# Patient Record
Sex: Female | Born: 1985 | Race: White | Hispanic: No | Smoking: Current every day smoker
Health system: Southern US, Community
[De-identification: ages and names within clinical notes are randomized; demographics above are authoritative.]

## PROBLEM LIST (undated history)

## (undated) ENCOUNTER — Emergency Department: Disposition: A | Discharge status: Home / Self Care | Payer: Self-pay

## (undated) DIAGNOSIS — F32A Depression, unspecified: Secondary | ICD-10-CM

## (undated) DIAGNOSIS — M5134 Other intervertebral disc degeneration, thoracic region: Secondary | ICD-10-CM

## (undated) DIAGNOSIS — J45909 Unspecified asthma, uncomplicated: Secondary | ICD-10-CM

## (undated) DIAGNOSIS — K769 Liver disease, unspecified: Secondary | ICD-10-CM

## (undated) DIAGNOSIS — E785 Hyperlipidemia, unspecified: Secondary | ICD-10-CM

## (undated) DIAGNOSIS — K219 Gastro-esophageal reflux disease without esophagitis: Secondary | ICD-10-CM

## (undated) DIAGNOSIS — L732 Hidradenitis suppurativa: Secondary | ICD-10-CM

## (undated) DIAGNOSIS — F209 Schizophrenia, unspecified: Secondary | ICD-10-CM

## (undated) DIAGNOSIS — F319 Bipolar disorder, unspecified: Secondary | ICD-10-CM

## (undated) DIAGNOSIS — F329 Major depressive disorder, single episode, unspecified: Secondary | ICD-10-CM

## (undated) HISTORY — PX: OTHER SURGICAL HISTORY: SHX169

## (undated) HISTORY — DX: Hidradenitis suppurativa: L73.2

## (undated) HISTORY — DX: Liver disease, unspecified: K76.9

## (undated) HISTORY — DX: Gastro-esophageal reflux disease without esophagitis: K21.9

## (undated) HISTORY — DX: Major depressive disorder, single episode, unspecified: F32.9

## (undated) HISTORY — PX: TUBAL LIGATION: SHX77

## (undated) HISTORY — DX: Hyperlipidemia, unspecified: E78.5

## (undated) HISTORY — DX: Other intervertebral disc degeneration, thoracic region: M51.34

## (undated) HISTORY — DX: Unspecified asthma, uncomplicated: J45.909

## (undated) HISTORY — DX: Schizophrenia, unspecified: F20.9

## (undated) HISTORY — DX: Bipolar disorder, unspecified: F31.9

## (undated) HISTORY — DX: Depression, unspecified: F32.A

---
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
## 2009-05-06 ENCOUNTER — Emergency Department: Payer: Self-pay | Admitting: Emergency Medicine

## 2009-07-31 ENCOUNTER — Encounter: Payer: Self-pay | Admitting: Obstetrics & Gynecology

## 2009-08-31 ENCOUNTER — Encounter: Payer: Self-pay | Admitting: Maternal & Fetal Medicine

## 2009-09-14 ENCOUNTER — Observation Stay: Payer: Self-pay

## 2009-10-02 ENCOUNTER — Encounter: Payer: Self-pay | Admitting: Maternal & Fetal Medicine

## 2009-11-13 ENCOUNTER — Encounter: Payer: Self-pay | Admitting: Obstetrics and Gynecology

## 2009-11-22 ENCOUNTER — Observation Stay: Payer: Self-pay

## 2009-12-04 ENCOUNTER — Ambulatory Visit: Payer: Self-pay

## 2009-12-05 ENCOUNTER — Inpatient Hospital Stay: Payer: Self-pay

## 2009-12-06 LAB — PATHOLOGY REPORT

## 2009-12-20 ENCOUNTER — Emergency Department: Payer: Self-pay | Admitting: Emergency Medicine

## 2009-12-21 ENCOUNTER — Emergency Department: Payer: Self-pay | Admitting: Emergency Medicine

## 2012-01-21 IMAGING — US US OB FOLLOW-UP - NRPT [facility]
1 series · 14 of 28 positions shown · non-contrast
Comparison: none

[Series 1: us ob follow-up - nrpt [facility] · 14 of 41 slices shown]
[im 2/41]
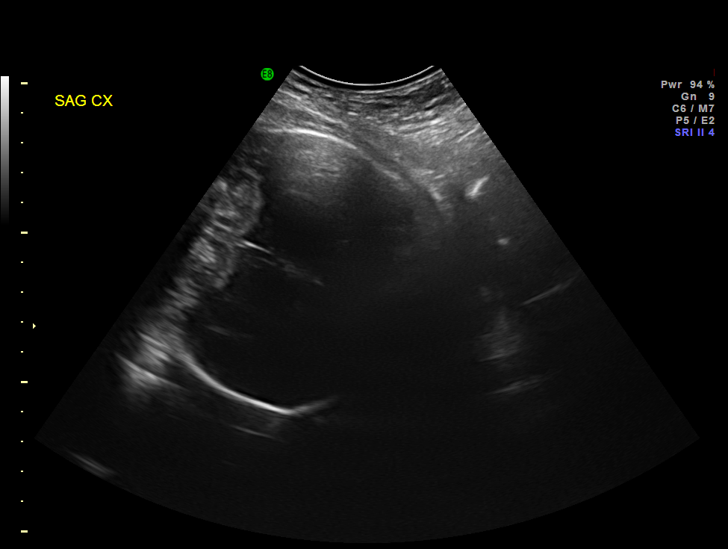
[im 5/41]
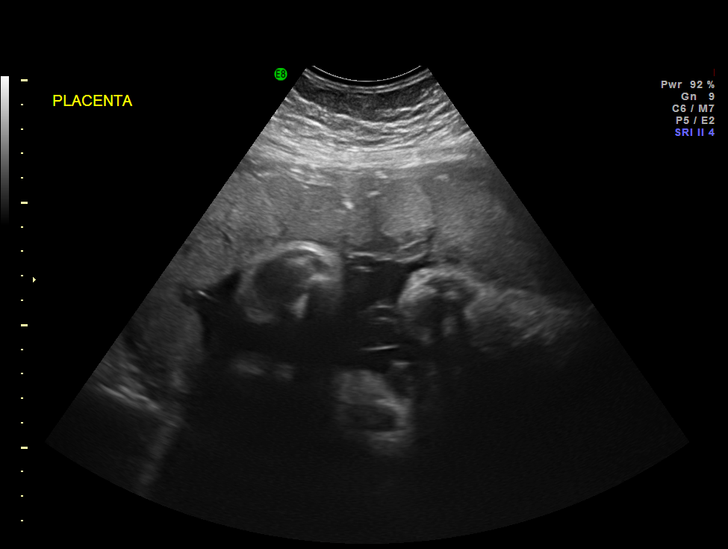
[im 8/41]
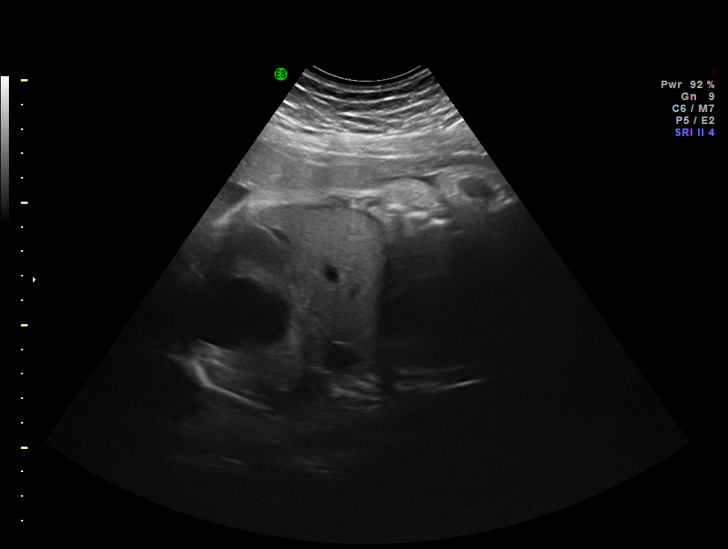
[im 11/41]
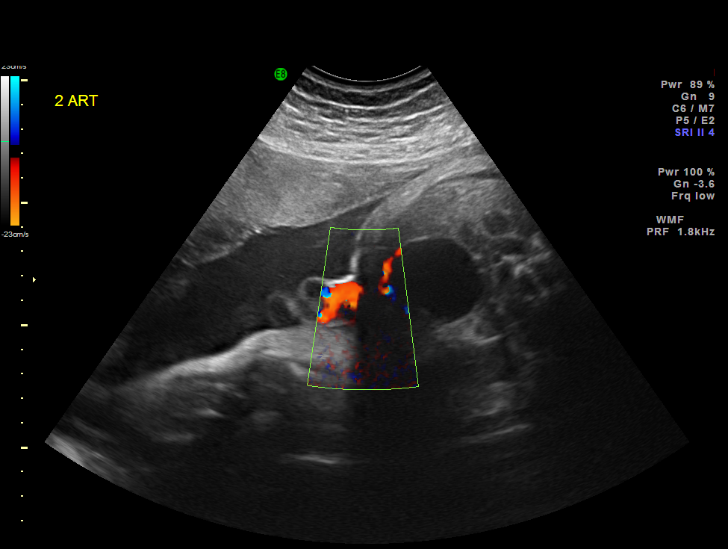
[im 14/41]
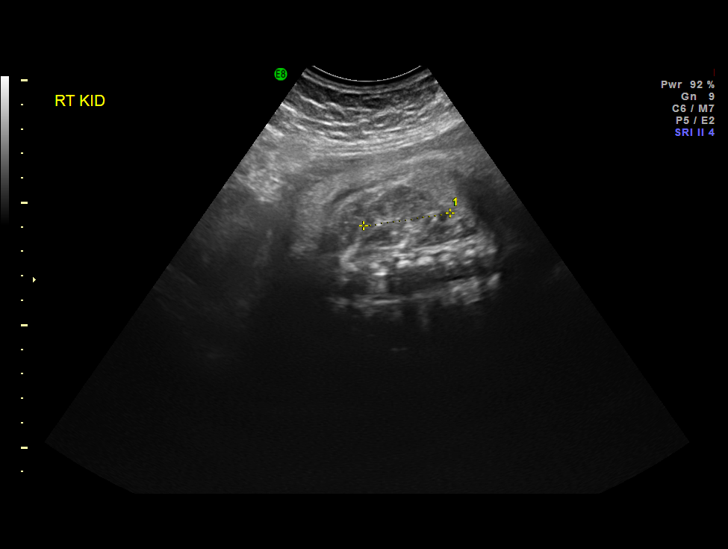
[im 17/41]
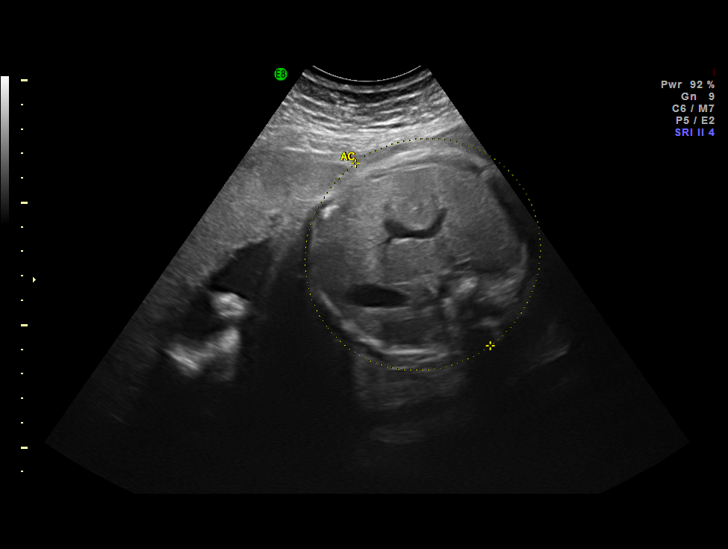
[im 20/41]
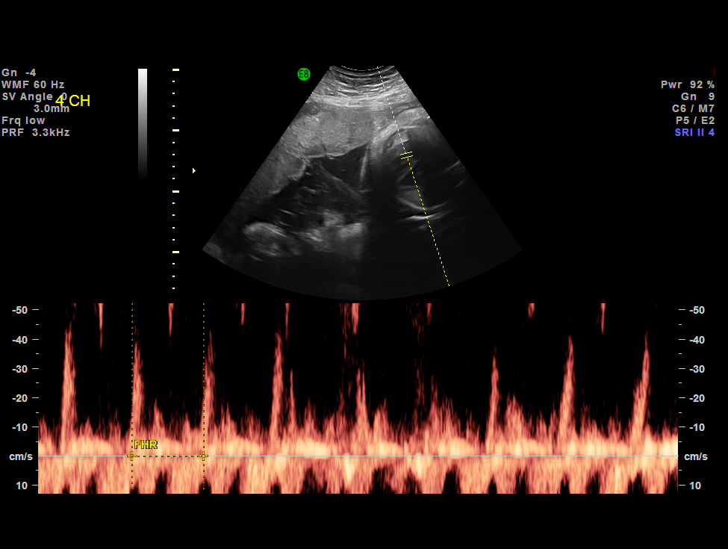
[im 23/41]
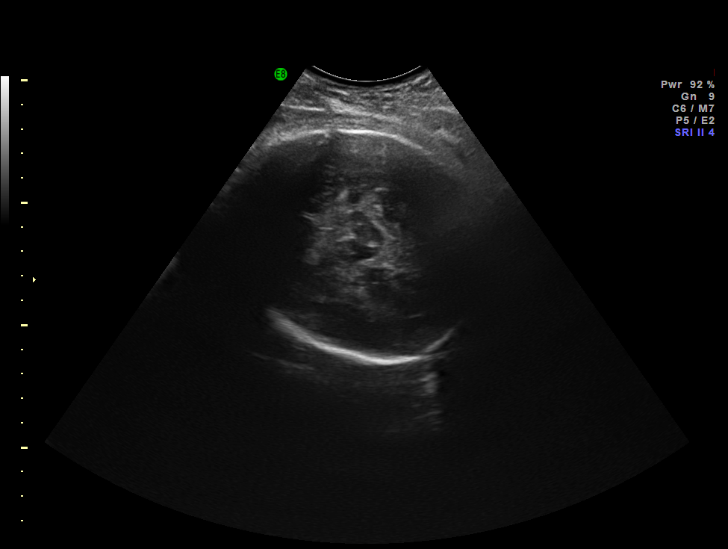
[im 26/41]
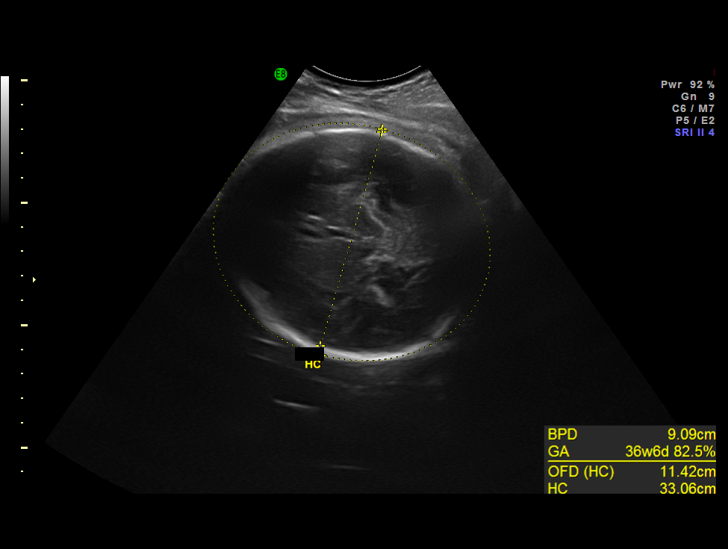
[im 29/41]
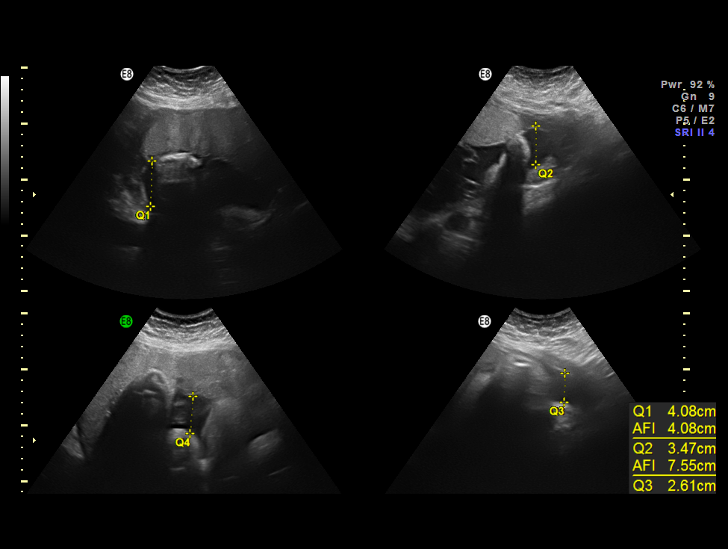
[im 32/41]
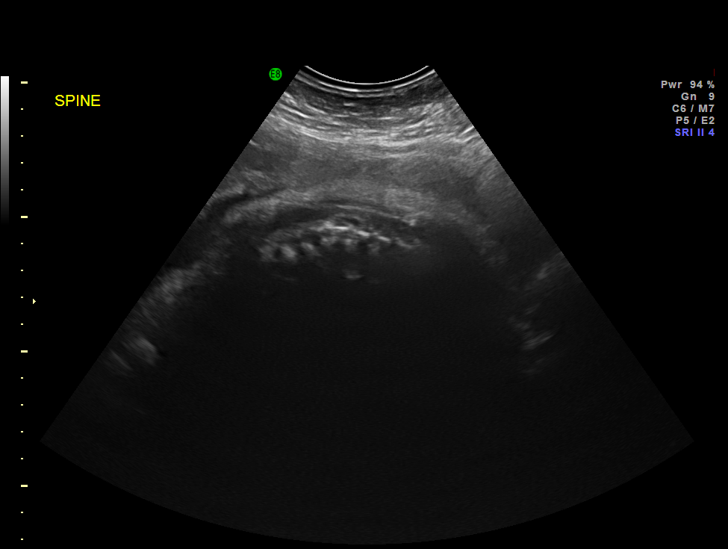
[im 35/41]
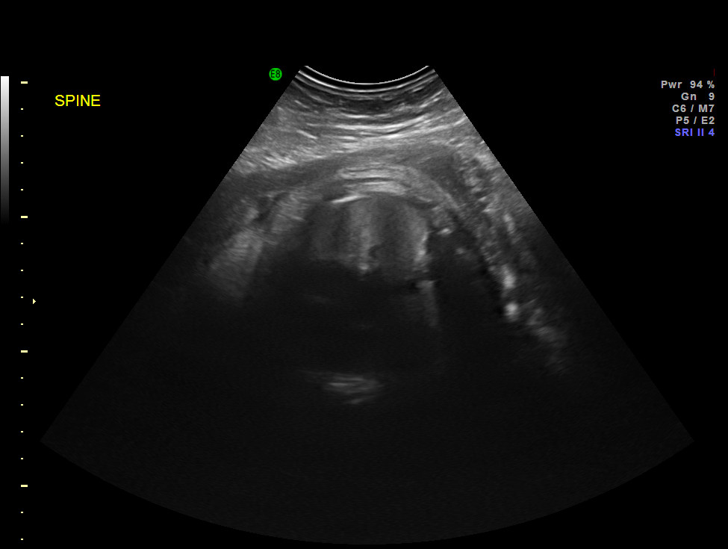
[im 38/41]
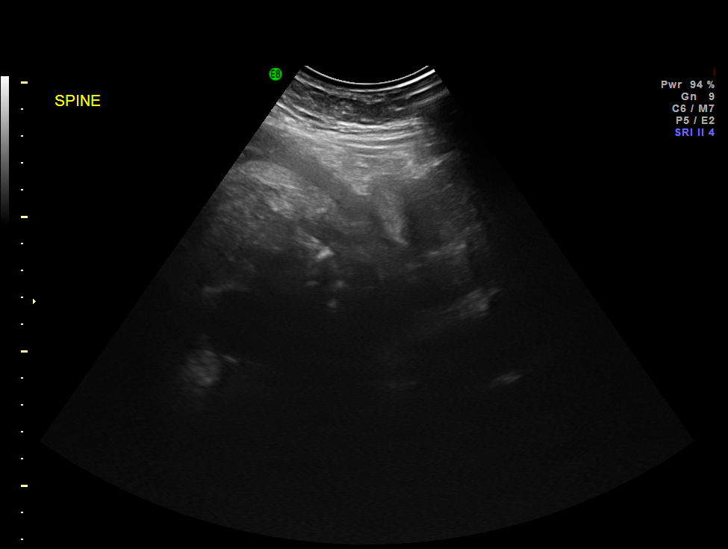
[im 41/41]
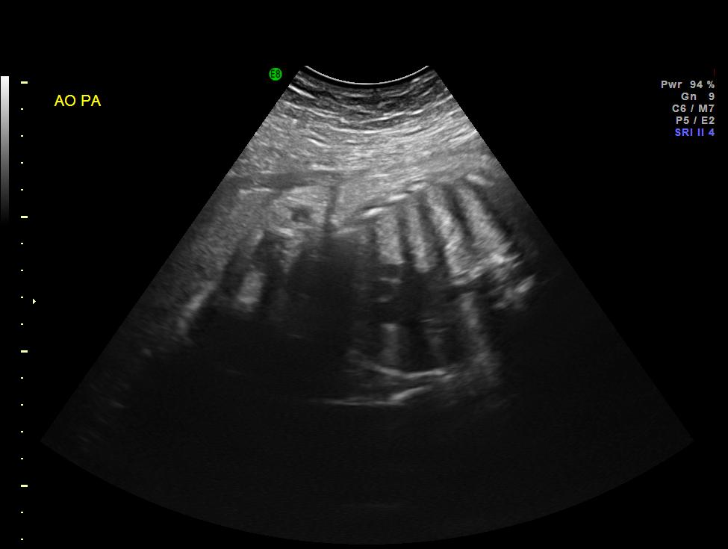

[14 of 28 positions shown; findings below may reference images not displayed]

IMAGES IMPORTED FROM THE SYNGO WORKFLOW SYSTEM
NO DICTATION FOR STUDY

## 2015-01-12 ENCOUNTER — Ambulatory Visit (INDEPENDENT_AMBULATORY_CARE_PROVIDER_SITE_OTHER): Payer: Managed Care, Other (non HMO) | Admitting: Allergy and Immunology

## 2015-01-12 ENCOUNTER — Encounter: Payer: Self-pay | Admitting: Allergy and Immunology

## 2015-01-12 VITALS — BP 122/64 | HR 72 | Temp 98.0°F | Resp 18 | Ht 63.5 in | Wt 213.8 lb

## 2015-01-12 DIAGNOSIS — J4520 Mild intermittent asthma, uncomplicated: Secondary | ICD-10-CM | POA: Diagnosis not present

## 2015-01-12 DIAGNOSIS — T7800XA Anaphylactic reaction due to unspecified food, initial encounter: Secondary | ICD-10-CM

## 2015-01-12 DIAGNOSIS — Z720 Tobacco use: Secondary | ICD-10-CM

## 2015-01-12 DIAGNOSIS — K219 Gastro-esophageal reflux disease without esophagitis: Secondary | ICD-10-CM

## 2015-01-12 MED ORDER — IPRATROPIUM-ALBUTEROL 0.5-2.5 (3) MG/3ML IN SOLN: 3.0000 mL | Freq: Once | RESPIRATORY_TRACT | Status: DC

## 2015-01-12 MED ORDER — ALBUTEROL SULFATE HFA 108 (90 BASE) MCG/ACT IN AERS: 2.0000 | INHALATION_SPRAY | RESPIRATORY_TRACT | Status: DC | PRN

## 2015-01-12 MED ORDER — ALBUTEROL SULFATE (2.5 MG/3ML) 0.083% IN NEBU
2.5000 mg | INHALATION_SOLUTION | Freq: Once | RESPIRATORY_TRACT | Status: AC
  Administered 2015-01-12: 2.5 mg via RESPIRATORY_TRACT

## 2015-01-12 MED ORDER — OMEPRAZOLE 40 MG PO CPDR: 40.0000 mg | DELAYED_RELEASE_CAPSULE | Freq: Every day | ORAL | Status: AC

## 2015-01-12 NOTE — Patient Instructions (Addendum)
  1. Allergen avoidance measures  2. Use nicotine replacement for tobacco smoke exposure  3. Epi-Pen, benadryl, MD / ER  4. If needed:   A. Ventolin HFA 2 puffs every 4-6 hours  B. OTC antihistamine  5. Get a flu vaccine  6. Challenge? - get milk component immunocap test  7. Treat reflux:   A. Taper off caffeine slowly  B. Omeprazole 40mg  one tablet on time per day  8. Return in one year or earlier if problem

## 2015-01-12 NOTE — Progress Notes (Signed)
[facility]    NEW PATIENT NOTE    Subjective:   Patient ID: Lauren Wall is a 29 y.o. female with a chief complaint of New Patient (Initial Visit)  and the following problems:  HPI Comments:  Lauren Wall presents this clinic with complaints of food allergy. Apparently in September she required an emergency room evaluation for the development of hives and breathing difficulties after consuming milk. Since that point time she is developed hives when she has exposure to any dairy products even if she touches the dairy product. She apparently self injected herself with an EpiPen on one occasion. There are no other associated systemic constitutional symptoms. She did have a history of "lactose intolerance", which was diagnosed many years ago manifested as bloating and diarrhea after consuming milk products. She might have developed some hives as well at that point in time. There are no other food products that cause her a problem.  Lauren Wall also has a history of asthma since childhood. She's been given a controller agent in the past. She is using her bronchodilator few times per month. Provoking factors include exposure to exercise and cold and pollen.. Her last administration of prednisone was years ago. She does smoke a prostate one half pack per day and has done so for 21 years. She smokes for stress reduction.  Lauren Wall also has an issue with reflux. She has reflux up into her throat. She vomits approximately one time per week. She drinks her coffee every day and has a soda every day.   Past Medical History  Diagnosis Date  . Asthma   . Hidradenitis suppurativa   . Bipolar disorder (HCC)   . DDD (degenerative disc disease), thoracic     Past Surgical History  Procedure Laterality Date  . C sections  2006, [id number], 2011     Current outpatient prescriptions:  .  acetaminophen (TYLENOL) 500 MG tablet, Take 1,000 mg by mouth daily as  needed., Disp: , Rfl:  .  albuterol (PROVENTIL HFA;VENTOLIN HFA) 108 (90 BASE) MCG/ACT inhaler, Inhale 2 puffs into the lungs every 4 (four) hours as needed., Disp: 1 Inhaler, Rfl: 1 .  clobetasol cream (TEMOVATE) 0.05 %, Apply 1 application topically 3 (three) times daily., Disp: , Rfl:  .  EPINEPHrine (EPIPEN 2-PAK) 0.3 mg/0.3 mL IJ SOAJ injection, Inject 0.3 mg into the muscle as needed., Disp: , Rfl:  .  lithium carbonate 300 MG capsule, Take 300 mg by mouth 2 (two) times daily., Disp: , Rfl:  .  lurasidone (LATUDA) 20 MG TABS tablet, Take 20 mg by mouth every evening., Disp: , Rfl:  .  Minocycline HCl 100 MG SOLR, Take 100 mg by mouth daily., Disp: , Rfl:  .  omeprazole (PRILOSEC) 40 MG capsule, Take 1 capsule (40 mg total) by mouth daily., Disp: 30 capsule, Rfl: 5  Meds ordered this encounter  Medications  . DISCONTD: ipratropium-albuterol (DUONEB) 0.5-2.5 (3) MG/3ML nebulizer solution 3 mL    Sig:   . albuterol (PROVENTIL HFA;VENTOLIN HFA) 108 (90 BASE) MCG/ACT inhaler    Sig: Inhale 2 puffs into the lungs every 4 (four) hours as needed.    Dispense:  1 Inhaler    Refill:  1  . omeprazole (PRILOSEC) 40 MG capsule    Sig: Take 1 capsule (40 mg total) by mouth daily.    Dispense:  30 capsule    Refill:  5  . albuterol (PROVENTIL) (2.5 MG/3ML) 0.083% nebulizer  solution 2.5 mg    Sig:     Allergies  Allergen Reactions  . Lactase Hives, Itching and Shortness Of Breath  . Sulfamethoxazole-Trimethoprim Hives    Welts and swelling  . Ibuprofen Hives    Welts, swelling, and sore throat    Review of Systems  Constitutional: Negative for fever, chills, weight loss and malaise/fatigue.  HENT: Negative for congestion, ear discharge, ear pain, hearing loss, nosebleeds, sore throat and tinnitus.   Eyes: Negative for blurred vision, pain, discharge and redness.  Respiratory: Negative for cough, hemoptysis, sputum production, shortness of breath, wheezing and stridor.   Cardiovascular:  Negative for chest pain, palpitations and leg swelling.  Gastrointestinal: Negative for heartburn, nausea, vomiting, abdominal pain and diarrhea.  Musculoskeletal: Negative for myalgias, back pain and joint pain.       Upper back degenerative disc disease  Skin: Positive for rash. Negative for itching.       Hidradenitis   Neurological: Negative for dizziness and headaches.  Psychiatric/Behavioral: Positive for depression.       Bipolar disorder, schizophrenia, and depression    Family History  Problem Relation Age of Onset  . Eczema Daughter     Social History   Social History  . Marital Status: Married    Spouse Name: N/A  . Number of Children: N/A  . Years of Education: N/A   Occupational History  . Not on file.   Social History Main Topics  . Smoking status: Smoker, Current Status Unknown -- 0.50 packs/day    Types: Cigarettes  . Smokeless tobacco: Not on file  . Alcohol Use: Not on file  . Drug Use: No  . Sexual Activity: Not on file   Other Topics Concern  . Not on file   Social History Narrative  . No narrative on file    Environmental and Social history  Lauren Wall lives in a house with a dry environment, carpeting in the bedroom, no animals located inside the house, actually smoking tobacco products, and no employment at this point in time.   Objective:   Filed Vitals:   01/12/15 0904  BP: 122/64  Pulse: 72  Temp: 98 F (36.7 C)  Resp: 18    Physical Exam  Constitutional: She is well-developed, well-nourished, and in no distress. No distress.  HENT:  Head: Normocephalic. Head is without laceration, without right periorbital erythema and without left periorbital erythema. Hair is normal.  Right Ear: Tympanic membrane and external ear normal. No lacerations. No drainage or tenderness. No foreign bodies. Tympanic membrane is not injected, not scarred, not perforated, not erythematous, not retracted and not bulging. No middle ear effusion.  Left Ear:  Tympanic membrane and external ear normal. No lacerations. No drainage or tenderness. No foreign bodies. Tympanic membrane is not injected, not scarred, not perforated, not erythematous, not retracted and not bulging.  No middle ear effusion.  Nose: Nose normal. No mucosal edema, rhinorrhea, nose lacerations or septal deviation.  No foreign bodies.  Mouth/Throat: Uvula is midline and oropharynx is clear and moist. No oral lesions. No uvula swelling. No oropharyngeal exudate, posterior oropharyngeal edema, posterior oropharyngeal erythema or tonsillar abscesses.  Eyes: Conjunctivae are normal. Pupils are equal, round, and reactive to light. Right eye exhibits no discharge. Left eye exhibits no discharge. No scleral icterus.  Neck: No tracheal deviation present. No thyromegaly present.  Cardiovascular: Normal rate, regular rhythm and normal heart sounds.  Exam reveals no gallop and no friction rub.   No murmur heard. Pulmonary/Chest: Effort  normal and breath sounds normal. No stridor. No respiratory distress. She has no wheezes. She has no rales. She exhibits no tenderness.  Abdominal: Soft. She exhibits no distension and no mass. There is no tenderness. There is no rebound and no guarding.  Musculoskeletal: She exhibits no edema or tenderness.  Lymphadenopathy:    She has no cervical adenopathy.  Neurological: She is alert. Gait normal.  Skin: No rash noted. She is not diaphoretic. No erythema. No pallor.  Psychiatric: Mood and affect normal.    Diagnostics:  Allergy skin tests were performed. She did not demonstrate any hypersensitivity to a screening panel of aeroallergens or foods including milk on  epicutaneous and intradermal skin testing  Spirometry was performed and demonstrated an FEV1 of 1.69 @ 55 % of predicted. Following the administration of nebulized albuterol her FEV1 did not increase.  The patient had an Asthma Control Test with the following results:  .  Not  completed   Assessment and Plan:    1. Allergy with anaphylaxis due to food, initial encounter   2. Mild intermittent asthma, uncomplicated   3. Tobacco abuse   4. Gastroesophageal reflux disease, esophagitis presence not specified     1. Allergen avoidance measures  2. Use nicotine replacement for tobacco smoke exposure  3. Epi-Pen, benadryl, MD / ER  4. If needed:   A. Ventolin HFA 2 puffs every 4-6 hours  B. OTC antihistamine  5. Get a flu vaccine  6. Challenge? - get milk component immunocap test  7. Treat reflux:   A. Taper off caffeine slowly  B. Omeprazole 40mg  one tablet on time per day  8. Return in one year or earlier if problem  I will investigate [place]'s potential milk allergy by checking a minute And if this is negative we will challenge her with milk in the clinic. I had a long talk with her today about the need to stop her tobacco hobby as this is no doubt tearing up her lungs. As well, we will treat her reflux which appears to be somewhat active with the therapy mentioned above.    Lauren SchimkeEric Lanetta Figuero, MD [facility]

## 2015-01-15 LAB — IGE MILK W/ COMPONENT REFLEX: Milk IgE: <0.1 kU/L

## 2015-01-16 NOTE — Progress Notes (Signed)
Patient advised results of labs appt made for challenge 11/21

## 2015-02-13 ENCOUNTER — Encounter: Payer: Managed Care, Other (non HMO) | Admitting: Allergy and Immunology

## 2015-11-28 ENCOUNTER — Emergency Department: Payer: Medicaid Other

## 2015-11-28 ENCOUNTER — Emergency Department
Admission: EM | Admit: 2015-11-28 | Discharge: 2015-11-28 | Disposition: A | Discharge status: Home / Self Care | Payer: Medicaid Other | Attending: Emergency Medicine | Admitting: Emergency Medicine

## 2015-11-28 ENCOUNTER — Encounter: Payer: Self-pay | Admitting: Medical Oncology

## 2015-11-28 DIAGNOSIS — F17210 Nicotine dependence, cigarettes, uncomplicated: Secondary | ICD-10-CM | POA: Diagnosis not present

## 2015-11-28 DIAGNOSIS — L03115 Cellulitis of right lower limb: Secondary | ICD-10-CM | POA: Diagnosis not present

## 2015-11-28 DIAGNOSIS — Z79899 Other long term (current) drug therapy: Secondary | ICD-10-CM | POA: Diagnosis not present

## 2015-11-28 DIAGNOSIS — M79671 Pain in right foot: Secondary | ICD-10-CM | POA: Diagnosis present

## 2015-11-28 DIAGNOSIS — Z791 Long term (current) use of non-steroidal anti-inflammatories (NSAID): Secondary | ICD-10-CM | POA: Insufficient documentation

## 2015-11-28 DIAGNOSIS — J45909 Unspecified asthma, uncomplicated: Secondary | ICD-10-CM | POA: Diagnosis not present

## 2015-11-28 LAB — CBC WITH DIFFERENTIAL/PLATELET
BASOS PCT: 1 %
Basophils Absolute: 0.2 10*3/uL — ABNORMAL HIGH (ref 0–0.1)
EOS ABS: 0.3 10*3/uL (ref 0–0.7)
EOS PCT: 2 %
HCT: 39.7 % (ref 35.0–47.0)
HEMOGLOBIN: 14.1 g/dL (ref 12.0–16.0)
LYMPHS ABS: 2.8 10*3/uL (ref 1.0–3.6)
Lymphocytes Relative: 20 %
MCH: 32.7 pg (ref 26.0–34.0)
MCHC: 35.5 g/dL (ref 32.0–36.0)
MCV: 91.9 fL (ref 80.0–100.0)
MONO ABS: 0.9 10*3/uL (ref 0.2–0.9)
MONOS PCT: 7 %
Neutro Abs: 9.4 10*3/uL — ABNORMAL HIGH (ref 1.4–6.5)
Neutrophils Relative %: 70 %
PLATELETS: 241 10*3/uL (ref 150–440)
RBC: 4.32 MIL/uL (ref 3.80–5.20)
RDW: 12.8 % (ref 11.5–14.5)
WBC: 13.6 10*3/uL — ABNORMAL HIGH (ref 3.6–11.0)

## 2015-11-28 LAB — URIC ACID: URIC ACID, SERUM: 6 mg/dL (ref 2.3–6.6)

## 2015-11-28 LAB — BASIC METABOLIC PANEL
Anion gap: 5 (ref 5–15)
BUN: 12 mg/dL (ref 6–20)
CALCIUM: 8.7 mg/dL — AB (ref 8.9–10.3)
CHLORIDE: 109 mmol/L (ref 101–111)
CO2: 24 mmol/L (ref 22–32)
CREATININE: 0.75 mg/dL (ref 0.44–1.00)
GFR calc Af Amer: >60 mL/min (ref >60)
GFR calc non Af Amer: >60 mL/min (ref >60)
Glucose, Bld: 85 mg/dL (ref 65–99)
Potassium: 4.6 mmol/L (ref 3.5–5.1)
Sodium: 138 mmol/L (ref 135–145)

## 2015-11-28 MED ORDER — HYDROCODONE-ACETAMINOPHEN 5-325 MG PO TABS
1.0000 | ORAL_TABLET | Freq: Once | ORAL | Status: AC
  Administered 2015-11-28: 1 via ORAL
  Filled 2015-11-28: qty 1

## 2015-11-28 MED ORDER — HYDROCODONE-ACETAMINOPHEN 5-325 MG PO TABS: 1.0000 | ORAL_TABLET | Freq: Four times a day (QID) | ORAL | 0 refills | Status: DC | PRN

## 2015-11-28 MED ORDER — CLINDAMYCIN HCL 300 MG PO CAPS: 300.0000 mg | ORAL_CAPSULE | Freq: Three times a day (TID) | ORAL | 0 refills | Status: DC

## 2015-11-28 MED ORDER — CLINDAMYCIN PHOSPHATE 600 MG/50ML IV SOLN
600.0000 mg | Freq: Once | INTRAVENOUS | Status: AC
  Administered 2015-11-28: 600 mg via INTRAVENOUS
  Filled 2015-11-28: qty 50

## 2015-11-28 NOTE — ED Triage Notes (Signed)
Rt foot pain that began yesterday without injury.

## 2015-11-28 NOTE — ED Provider Notes (Signed)
[facility] Emergency Department Provider Note  ____________________________________________  Time seen: Approximately 8:03 AM  I have reviewed the triage vital signs and the nursing notes.   HISTORY  Chief Complaint Foot Pain    HPI Lauren Wall is a 30 y.o. female , NAD, presents to the emergency department with 1 day history of right foot pain and swelling. States pain began yesterday without any injury or trauma. Noted swelling increase over the last 12 hours. Has not noted any open wounds, skin sores, redness or abnormal warmth to the foot or leg. Does note that it hurts about the distal portion of her foot and ball of the foot and she is unable to bear weight due to significant pain. Denies any falls. Has not had any numbness, weakness, tingling. States pain is a shooting pain when she attempts to bear weight. Has not had any fevers, chills, body aches. Has had no abdominal pain, nausea, vomiting. Does note a history of prediabetes and states her blood sugars run anywhere from 80s to 300s. She did check her blood sugar 2 days ago and it was in the 80s.   Past Medical History:  Diagnosis Date  . Asthma   . Bipolar disorder (HCC)   . DDD (degenerative disc disease), thoracic   . Hidradenitis suppurativa     There are no active problems to display for this patient.   Past Surgical History:  Procedure Laterality Date  . c sections  2006, 2007,2009, 2011    Prior to Admission medications   Medication Sig Start Date End Date Taking? Authorizing Provider  acetaminophen (TYLENOL) 500 MG tablet Take 1,000 mg by mouth daily as needed.    Historical Provider, MD  albuterol (PROVENTIL HFA;VENTOLIN HFA) 108 (90 BASE) MCG/ACT inhaler Inhale 2 puffs into the lungs every 4 (four) hours as needed. 01/12/15 01/12/16  Jessica PriestEric J Kozlow, MD  clindamycin (CLEOCIN) 300 MG capsule Take 1 capsule (300 mg total) by mouth 3 (three) times daily. 11/28/15   Melessia Kaus L Mike Hamre, PA-C   clobetasol cream (TEMOVATE) 0.05 % Apply 1 application topically 3 (three) times daily.    Historical Provider, MD  EPINEPHrine (EPIPEN 2-PAK) 0.3 mg/0.3 mL IJ SOAJ injection Inject 0.3 mg into the muscle as needed. 11/25/14   Historical Provider, MD  HYDROcodone-acetaminophen (NORCO) 5-325 MG tablet Take 1 tablet by mouth every 6 (six) hours as needed for severe pain. 11/28/15   Kyliee Ortego L Velencia Lenart, PA-C  lithium carbonate 300 MG capsule Take 300 mg by mouth 2 (two) times daily.    Historical Provider, MD  lurasidone (LATUDA) 20 MG TABS tablet Take 20 mg by mouth every evening.    Historical Provider, MD  Minocycline HCl 100 MG SOLR Take 100 mg by mouth daily.    Historical Provider, MD  omeprazole (PRILOSEC) 40 MG capsule Take 1 capsule (40 mg total) by mouth daily. 01/12/15   Jessica PriestEric J Kozlow, MD    Allergies Lactase; Sulfamethoxazole-trimethoprim; and Ibuprofen  Family History  Problem Relation Age of Onset  . Eczema Daughter     Social History Social History  Substance Use Topics  . Smoking status: Smoker, Current Status Unknown    Packs/day: 0.50    Types: Cigarettes  . Smokeless tobacco: Not on file  . Alcohol use Not on file     Review of Systems  Constitutional: No fever/chills Cardiovascular: No chest pain, Palpitations. Respiratory: No cough. No shortness of breath. No wheezing.  Gastrointestinal: No abdominal pain.  No nausea,  vomiting.   Musculoskeletal: Positive right foot pain. Negative ankle, lower leg, knee, hip, back pain.  Skin: Positive swelling right foot. Negative for rash, redness, skin sores, open wounds, abnormal warmth. Neurological: Negative for headaches, focal weakness or numbness. No tingling. 10-point ROS otherwise negative.  ____________________________________________   PHYSICAL EXAM:  VITAL SIGNS: ED Triage Vitals  Enc Vitals Group     BP 11/28/15 0739 117/79     Pulse Rate 11/28/15 0739 98     Resp 11/28/15 0739 19     Temp 11/28/15 0739 97.5  F (36.4 C)     Temp Source 11/28/15 0739 Oral     SpO2 11/28/15 0739 99 %     Weight 11/28/15 0738 190 lb (86.2 kg)     Height 11/28/15 0738 5\' 3"  (1.6 m)     Head Circumference --      Peak Flow --      Pain Score 11/28/15 0738 5     Pain Loc --      Pain Edu? --      Excl. in GC? --      Constitutional: Alert and oriented. Well appearing and in no acute distress. Eyes: Conjunctivae are normal.  Head: Atraumatic. Cardiovascular: Good peripheral circulationWith 2+ pulses noted about the right lower extremity. Capillary refill is brisk in all digits of the right foot. Respiratory: Normal respiratory effort without tachypnea or retractions.  Musculoskeletal: Tenderness to palpation about the distal right foot including toes as well as the ball of the foot. Mild swelling is noted but no erythema or warmth. Full range of motion of the right ankle. Patient not willing to move the right foot or toes due to pain. No lower extremity tenderness nor edema.  No joint effusions. Neurologic:  Normal speech and language. No gross focal neurologic deficits are appreciated. Sensation light touch grossly intact about the right lower extremity Skin:  Skin is warm, dry and intact. No rash or redness, skin sores, open wounds noted. Psychiatric: Mood and affect are normal. Speech and behavior are normal. Patient exhibits appropriate insight and judgement.   ____________________________________________   LABS (all labs ordered are listed, but only abnormal results are displayed)  Labs Reviewed  BASIC METABOLIC PANEL - Abnormal; Notable for the following:       Result Value   Calcium 8.7 (*)    All other components within normal limits  CBC WITH DIFFERENTIAL/PLATELET - Abnormal; Notable for the following:    WBC 13.6 (*)    Neutro Abs 9.4 (*)    Basophils Absolute 0.2 (*)    All other components within normal limits  URIC ACID    ____________________________________________  EKG  None ____________________________________________  RADIOLOGY I have personally viewed and evaluated these images (plain radiographs) as part of my medical decision making, as well as reviewing the written report by the radiologist.  Dg Foot Complete Right  Result Date: 11/28/2015 CLINICAL DATA:  Right foot pain at the base of the great toe. No acute injury. EXAM: RIGHT FOOT COMPLETE - 3+ VIEW COMPARISON:  None. FINDINGS: There is soft tissue swelling over the dorsum of the foot at the level of the MTP joints. No acute osseous abnormality is present. The joints are located. No significant joint disease is evident. IMPRESSION: 1. No acute or focal osseous abnormality. 2. Soft tissue swelling over the dorsum of the foot. This could represent soft tissue injury or cellulitis. Electronically Signed   By: Marin Roberts M.D.   On: 11/28/2015  08:24    ____________________________________________    PROCEDURES  Procedure(s) performed: None   Procedures   Medications  clindamycin (CLEOCIN) IVPB 600 mg (600 mg Intravenous New Bag/Given 11/28/15 1012)  HYDROcodone-acetaminophen (NORCO/VICODIN) 5-325 MG per tablet 1 tablet (1 tablet Oral Given 11/28/15 0904)     ____________________________________________   INITIAL IMPRESSION / ASSESSMENT AND PLAN / ED COURSE  Pertinent labs & imaging results that were available during my care of the patient were reviewed by me and considered in my medical decision making (see chart for details).  Clinical Course    Patient's diagnosis is consistent with Cellulitis of right foot. Patient will be discharged home with prescriptions for clindamycin and Norco to take as directed. Patient was given crutches to assist with ambulation. She is to keep the right foot elevated when not ambulating. Work note given to excuse over the next 3 days. Patient is to follow up with her primary care provider in 2-3  days for recheck. Patient is given ED precautions to return to the ED for any worsening or new symptoms.    ____________________________________________  FINAL CLINICAL IMPRESSION(S) / ED DIAGNOSES  Final diagnoses:  Cellulitis of right foot      NEW MEDICATIONS STARTED DURING THIS VISIT:  New Prescriptions   CLINDAMYCIN (CLEOCIN) 300 MG CAPSULE    Take 1 capsule (300 mg total) by mouth 3 (three) times daily.   HYDROCODONE-ACETAMINOPHEN (NORCO) 5-325 MG TABLET    Take 1 tablet by mouth every 6 (six) hours as needed for severe pain.         Hope Pigeon, PA-C 11/28/15 1045    Emily Filbert, MD 11/28/15 [phone]

## 2015-11-28 NOTE — ED Notes (Signed)
Pt alert and oriented X4, active, cooperative, pt in NAD. RR even and unlabored, color WNL.  Pt informed to return if any life threatening symptoms occur.   

## 2015-11-28 NOTE — ED Notes (Signed)
See triage note  Right foot pain since yesterday   Denies any specific injury  No deformity noted

## 2015-11-28 NOTE — Discharge Instructions (Signed)
Keep right foot elevated when not ambulating. Use crutches to limit weight bearing.

## 2016-01-15 ENCOUNTER — Ambulatory Visit: Payer: Managed Care, Other (non HMO) | Admitting: Allergy and Immunology

## 2016-03-12 ENCOUNTER — Emergency Department
Admission: EM | Admit: 2016-03-12 | Discharge: 2016-03-12 | Disposition: A | Discharge status: Home / Self Care | Payer: Medicaid Other | Attending: Emergency Medicine | Admitting: Emergency Medicine

## 2016-03-12 DIAGNOSIS — J020 Streptococcal pharyngitis: Secondary | ICD-10-CM | POA: Diagnosis not present

## 2016-03-12 DIAGNOSIS — Z79899 Other long term (current) drug therapy: Secondary | ICD-10-CM | POA: Insufficient documentation

## 2016-03-12 DIAGNOSIS — J45909 Unspecified asthma, uncomplicated: Secondary | ICD-10-CM | POA: Insufficient documentation

## 2016-03-12 DIAGNOSIS — H65191 Other acute nonsuppurative otitis media, right ear: Secondary | ICD-10-CM | POA: Insufficient documentation

## 2016-03-12 DIAGNOSIS — F17210 Nicotine dependence, cigarettes, uncomplicated: Secondary | ICD-10-CM | POA: Insufficient documentation

## 2016-03-12 DIAGNOSIS — H65111 Acute and subacute allergic otitis media (mucoid) (sanguinous) (serous), right ear: Secondary | ICD-10-CM

## 2016-03-12 DIAGNOSIS — H9201 Otalgia, right ear: Secondary | ICD-10-CM | POA: Diagnosis present

## 2016-03-12 LAB — POCT RAPID STREP A: Streptococcus, Group A Screen (Direct): NEGATIVE

## 2016-03-12 MED ORDER — CETIRIZINE HCL 10 MG PO TABS: 10.0000 mg | ORAL_TABLET | Freq: Every day | ORAL | 0 refills | Status: DC

## 2016-03-12 MED ORDER — AMOXICILLIN 400 MG/5ML PO SUSR: 1000.0000 mg | Freq: Two times a day (BID) | ORAL | 0 refills | Status: DC

## 2016-03-12 MED ORDER — FLUTICASONE PROPIONATE 50 MCG/ACT NA SUSP: 1.0000 | Freq: Two times a day (BID) | NASAL | 0 refills | Status: DC

## 2016-03-12 MED ORDER — MAGIC MOUTHWASH W/LIDOCAINE: 5.0000 mL | Freq: Four times a day (QID) | ORAL | 0 refills | Status: DC

## 2016-03-12 NOTE — ED Notes (Signed)
Pt. States "i need pain medicine" and informed that provider will order pain medication deemed most appropriate because this nurse is unqualified to make that decision.

## 2016-03-12 NOTE — ED Notes (Signed)

## 2016-03-12 NOTE — ED Triage Notes (Signed)
Pt in with co sore throat and right earache x 2 days, hx of strep states feels the same.

## 2016-03-12 NOTE — ED Provider Notes (Signed)
[facility] Emergency Department Provider Note  ____________________________________________  Time seen: Approximately 9:32 PM  I have reviewed the triage vital signs and the nursing notes.   HISTORY  Chief Complaint Sore Throat    HPI Lauren HellingJane E Wall is a 30 y.o. female who presents to emergency department complaining of right ear pain and sore throat. Patient states that her daughter has a viral infection and over the past several days she has had increasing symptoms of nasal congestion, ear pain, sore throat. Patient reports that she has also had a fever with this complaint. Patient reports fever does respond well to Tylenol or Motrin. She denies any headache, visual changes, neck pain, chest pain, shortness of breath, abdominal pain, nausea or vomiting. Patient has had some nasal congestion. She reports right ear pain that is increasing in intensity. She also reports sharp sore throat. Patient has had a very sporadic, mild, clearing cough. No definitive coughing. Patient states that she has a history of recurrent strep infections and this is consistent with previous strep infection.   Past Medical History:  Diagnosis Date  . Asthma   . Bipolar disorder (HCC)   . DDD (degenerative disc disease), thoracic   . Hidradenitis suppurativa     There are no active problems to display for this patient.   Past Surgical History:  Procedure Laterality Date  . c sections  2006, 2007,2009, 2011    Prior to Admission medications   Medication Sig Start Date End Date Taking? Authorizing Provider  acetaminophen (TYLENOL) 500 MG tablet Take 1,000 mg by mouth daily as needed.    Historical Provider, MD  albuterol (PROVENTIL HFA;VENTOLIN HFA) 108 (90 BASE) MCG/ACT inhaler Inhale 2 puffs into the lungs every 4 (four) hours as needed. 01/12/15 01/12/16  Jessica PriestEric J Kozlow, MD  amoxicillin (AMOXIL) 400 MG/5ML suspension Take 12.5 mLs (1,000 mg total) by mouth 2 (two) times daily.  03/12/16   Delorise RoyalsJonathan D Seon Gaertner, PA-C  cetirizine (ZYRTEC) 10 MG tablet Take 1 tablet (10 mg total) by mouth daily. 03/12/16   Delorise RoyalsJonathan D Sirron Francesconi, PA-C  clindamycin (CLEOCIN) 300 MG capsule Take 1 capsule (300 mg total) by mouth 3 (three) times daily. 11/28/15   Jami L Hagler, PA-C  clobetasol cream (TEMOVATE) 0.05 % Apply 1 application topically 3 (three) times daily.    Historical Provider, MD  EPINEPHrine (EPIPEN 2-PAK) 0.3 mg/0.3 mL IJ SOAJ injection Inject 0.3 mg into the muscle as needed. 11/25/14   Historical Provider, MD  fluticasone (FLONASE) 50 MCG/ACT nasal spray Place 1 spray into both nostrils 2 (two) times daily. 03/12/16   Delorise RoyalsJonathan D Sabrinna Yearwood, PA-C  HYDROcodone-acetaminophen (NORCO) 5-325 MG tablet Take 1 tablet by mouth every 6 (six) hours as needed for severe pain. 11/28/15   Jami L Hagler, PA-C  lithium carbonate 300 MG capsule Take 300 mg by mouth 2 (two) times daily.    Historical Provider, MD  lurasidone (LATUDA) 20 MG TABS tablet Take 20 mg by mouth every evening.    Historical Provider, MD  magic mouthwash w/lidocaine SOLN Take 5 mLs by mouth 4 (four) times daily. 03/12/16   Delorise RoyalsJonathan D Rajveer Handler, PA-C  Minocycline HCl 100 MG SOLR Take 100 mg by mouth daily.    Historical Provider, MD  omeprazole (PRILOSEC) 40 MG capsule Take 1 capsule (40 mg total) by mouth daily. 01/12/15   Jessica PriestEric J Kozlow, MD    Allergies Lactase; Sulfamethoxazole-trimethoprim; and Ibuprofen  Family History  Problem Relation Age of Onset  . Eczema Daughter  Social History Social History  Substance Use Topics  . Smoking status: Smoker, Current Status Unknown    Packs/day: 0.50    Types: Cigarettes  . Smokeless tobacco: Not on file  . Alcohol use Not on file     Review of Systems  Constitutional: No fever/chills Eyes: No visual changes. No discharge ENT: Positive for nasal congestion. Positive for right ear pain. Positive for sore throat. Cardiovascular: no chest pain. Respiratory: no cough.  No SOB. Gastrointestinal: No abdominal pain.  No nausea, no vomiting.  No diarrhea.  No constipation. Musculoskeletal: Negative for musculoskeletal pain. Skin: Negative for rash, abrasions, lacerations, ecchymosis. Neurological: Negative for headaches, focal weakness or numbness. 10-point ROS otherwise negative.  ____________________________________________   PHYSICAL EXAM:  VITAL SIGNS: ED Triage Vitals [03/12/16 2023]  Enc Vitals Group     BP 125/65     Pulse Rate (!) 118     Resp 20     Temp 100 F (37.8 C)     Temp Source Oral     SpO2 98 %     Weight 210 lb (95.3 kg)     Height 5\' 5"  (1.651 m)     Head Circumference      Peak Flow      Pain Score 8     Pain Loc      Pain Edu?      Excl. in GC?      Constitutional: Alert and oriented. Well appearing and in no acute distress. Eyes: Conjunctivae are normal. PERRL. EOMI. Head: Atraumatic. ENT:      Ears: EACs bilaterally are unremarkable. TM on right is erythematous, bulging, air-fluid level. TM on left is unremarkable.      Nose: Moderate congestion/rhinnorhea.      Mouth/Throat: Mucous membranes are moist. Oropharynx is erythematous but nonedematous. Uvula is midline. Tonsils are erythematous, edematous, with white exudates. Neck: No stridor.  No cervical spine tenderness to palpation. Neck is supple full range of motion Hematological/Lymphatic/Immunilogical: Diffuse, mobile, tender anterior cervical lymphadenopathy. Cardiovascular: Normal rate, regular rhythm. Normal S1 and S2.  Good peripheral circulation. Respiratory: Normal respiratory effort without tachypnea or retractions. Lungs CTAB. Good air entry to the bases with no decreased or absent breath sounds. Musculoskeletal: Full range of motion to all extremities. No gross deformities appreciated. Neurologic:  Normal speech and language. No gross focal neurologic deficits are appreciated.  Skin:  Skin is warm, dry and intact. No rash noted. Psychiatric: Mood and  affect are normal. Speech and behavior are normal. Patient exhibits appropriate insight and judgement.   ____________________________________________   LABS (all labs ordered are listed, but only abnormal results are displayed)  Labs Reviewed  POCT RAPID STREP A   ____________________________________________  EKG   ____________________________________________  RADIOLOGY   No results found.  ____________________________________________    PROCEDURES  Procedure(s) performed:    Procedures    Medications - No data to display   ____________________________________________   INITIAL IMPRESSION / ASSESSMENT AND PLAN / ED COURSE  Pertinent labs & imaging results that were available during my care of the patient were reviewed by me and considered in my medical decision making (see chart for details).  Review of the [place] CSRS was performed in accordance of the [facility] prior to dispensing any controlled drugs.  Clinical Course     Patient's diagnosis is consistent with Strep pharyngitis and right-sided otitis media. Patient started off with viral-like symptoms but rapidly worsened with sore throat and right ear pain. Exam is consistent with  right-sided otitis media. Patient had a negative strep test emergency department by me to 4 out of 5 centor criteria for strep. Due to this, coupled with exam, patient will be diagnosed with strep. Patient is treated with antibiotics for both complaints. She is also given symptom control medication. Patient requested that antibiotics be liquid as solids hurt to swallow. Patient is able to maintain her own airway, manage secretions, swallow, stay hydrated.. No indication for labs or imaging at this time. Patient will follow-up with primary care Patient is given ED precautions to return to the ED for any worsening or new symptoms.     ____________________________________________  FINAL CLINICAL IMPRESSION(S) / ED DIAGNOSES  Final  diagnoses:  Strep pharyngitis  Acute mucoid otitis media of right ear      NEW MEDICATIONS STARTED DURING THIS VISIT:  Discharge Medication List as of 03/12/2016  9:33 PM    START taking these medications   Details  amoxicillin (AMOXIL) 400 MG/5ML suspension Take 12.5 mLs (1,000 mg total) by mouth 2 (two) times daily., Starting Tue 03/12/2016, Print    cetirizine (ZYRTEC) 10 MG tablet Take 1 tablet (10 mg total) by mouth daily., Starting Tue 03/12/2016, Print    fluticasone (FLONASE) 50 MCG/ACT nasal spray Place 1 spray into both nostrils 2 (two) times daily., Starting Tue 03/12/2016, Print    magic mouthwash w/lidocaine SOLN Take 5 mLs by mouth 4 (four) times daily., Starting Tue 03/12/2016, Print            This chart was dictated using voice recognition software/Dragon. Despite best efforts to proofread, errors can occur which can change the meaning. Any change was purely unintentional.    Racheal PatchesJonathan D Aveah Castell, PA-C 03/12/16 2354    Phineas SemenGraydon Goodman, MD 03/13/16 [phone]

## 2016-03-12 NOTE — ED Notes (Signed)
Pt. States sore throat, ear ache, body aches, stiff neck, hurts to swallow, fever, cough, "feels like I'm going to pass out" x 2 days. Pt. States "took tylenol at home, got stuck and came back out". Pt. Reports nausea, denies V/D.

## 2016-07-24 ENCOUNTER — Ambulatory Visit: Payer: Self-pay | Admitting: Family Medicine

## 2016-08-02 ENCOUNTER — Encounter: Payer: Self-pay | Admitting: Family Medicine

## 2016-08-02 ENCOUNTER — Ambulatory Visit (INDEPENDENT_AMBULATORY_CARE_PROVIDER_SITE_OTHER): Payer: Self-pay | Admitting: Family Medicine

## 2016-08-02 VITALS — BP 119/79 | HR 98 | Temp 99.3°F | Ht 64.0 in | Wt 228.7 lb

## 2016-08-02 DIAGNOSIS — Z91018 Allergy to other foods: Secondary | ICD-10-CM

## 2016-08-02 DIAGNOSIS — B009 Herpesviral infection, unspecified: Secondary | ICD-10-CM

## 2016-08-02 DIAGNOSIS — Z7689 Persons encountering health services in other specified circumstances: Secondary | ICD-10-CM

## 2016-08-02 DIAGNOSIS — F339 Major depressive disorder, recurrent, unspecified: Secondary | ICD-10-CM

## 2016-08-02 DIAGNOSIS — N926 Irregular menstruation, unspecified: Secondary | ICD-10-CM

## 2016-08-02 LAB — PREGNANCY, URINE: Preg Test, Ur: NEGATIVE

## 2016-08-02 MED ORDER — VALACYCLOVIR HCL 1 G PO TABS: 1000.0000 mg | ORAL_TABLET | Freq: Every day | ORAL | 6 refills | Status: AC

## 2016-08-02 MED ORDER — DIPHENHYDRAMINE HCL 50 MG PO TABS: 50.0000 mg | ORAL_TABLET | Freq: Every evening | ORAL | 6 refills | Status: AC | PRN

## 2016-08-02 MED ORDER — EPINEPHRINE 0.3 MG/0.3ML IJ SOAJ: 0.3000 mg | INTRAMUSCULAR | 6 refills | Status: AC | PRN

## 2016-08-02 NOTE — Progress Notes (Signed)
BP 119/79 (BP Location: Right Arm, Patient Position: Sitting, Cuff Size: Large)   Pulse 98   Temp 99.3 F (37.4 C)   Ht 5\' 4"  (1.626 m)   Wt 228 lb 11.2 oz (103.7 kg)   LMP 06/19/2016 (Approximate)   SpO2 99%   BMI 39.26 kg/m    Subjective:    Patient ID: Lauren Wall, female    DOB: 12/24/1985, 31 y.o.   MRN: [id number]  HPI: Lauren HellingJane E Fujita is a 31 y.o. female  Chief Complaint  Patient presents with  . Establish Care   Patient presents today to establish care. Was last seen about a year ago by a family provider.   Has been out of birth control for one month and has now missed a period. Hx of tubal ligation, but it failed and she had a miscarriage a year ago. Wanting to check a pregnancy test today. Has not taken a home pregnancy test. No sxs that she has noticed aside from missed menses.   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 Tries to avoid milk products as much as possible but does still have small amounts rarely.   Hx of HSV, needing refill on valtrex. No issues or side effects with the medicine. States it controls her outbreaks well.   Hx of bipolar depression, has been off all medications for several months and needing to be connected with a psychiatrist to get her back on track. Previously on lithium and latuda. Denies SI/HI.   Past Medical History:  Diagnosis Date  . Asthma   . Bipolar disorder (HCC)   . DDD (degenerative disc disease), thoracic   . Depression   . GERD (gastroesophageal reflux disease)   . Hidradenitis suppurativa   . Hyperlipidemia   . Liver disease   . Schizophrenia [facility](HCC)    Social History   Social History  . Marital status: Married    Spouse name: N/A  . Number of children: N/A  . Years of education: N/A   Occupational History  . Not on file.   Social History Main Topics  . Smoking status: Current Every Day Smoker    Packs/day: 0.50    Types: Cigarettes  . Smokeless tobacco: Never Used  .  Alcohol use 0.6 oz/week    1 Glasses of wine per week  . Drug use: No  . Sexual activity: Yes    Birth control/ protection: None   Other Topics Concern  . Not on file   Social History Narrative  . No narrative on file   Relevant past medical, surgical, family and social history reviewed and updated as indicated. Interim medical history since our last visit reviewed. Allergies and medications reviewed and updated.  Review of Systems  Constitutional: Negative.   HENT: Negative.   Eyes: Negative.   Respiratory: Negative.   Cardiovascular: Negative.   Gastrointestinal: Negative.   Genitourinary: Negative.   Musculoskeletal: Negative.   Neurological: Negative.   Psychiatric/Behavioral: Negative.     Per HPI unless specifically indicated above     Objective:    BP 119/79 (BP Location: Right Arm, Patient Position: Sitting, Cuff Size: Large)   Pulse 98   Temp 99.3 F (37.4 C)   Ht 5\' 4"  (1.626 m)   Wt 228 lb 11.2 oz (103.7 kg)   LMP 06/19/2016 (Approximate)   SpO2 99%   BMI 39.26 kg/m   Wt Readings from Last 3 Encounters:  08/02/16 228  lb 11.2 oz (103.7 kg)  03/12/16 210 lb (95.3 kg)  11/28/15 190 lb (86.2 kg)    Physical Exam  Constitutional: She is oriented to person, place, and time. She appears well-developed and well-nourished.  HENT:  Head: Atraumatic.  Eyes: Conjunctivae are normal. Pupils are equal, round, and reactive to light.  Neck: Normal range of motion. Neck supple.  Cardiovascular: Normal rate and normal heart sounds.   Pulmonary/Chest: Effort normal and breath sounds normal. No respiratory distress.  Musculoskeletal: Normal range of motion.  Neurological: She is alert and oriented to person, place, and time.  Skin: Skin is warm and dry.  Psychiatric: She has a normal mood and affect. Her behavior is normal.  Nursing note and vitals reviewed.     Assessment & Plan:   Problem List Items Addressed This Visit      Other   Depression, recurrent  [facility])    Referral to Psychiatry sent to get back on track with treatments and therapy. Discussed if ever SI/HI, to go to the ER or [facility] walk in hours immediately.       Relevant Orders   Ambulatory referral to Psychiatry   HSV (herpes simplex virus) infection    Stable. Valtrex refills sent. Continue current regimen.       Relevant Medications   valACYclovir (VALTREX) 1000 MG tablet    Other Visit Diagnoses    Encounter to establish care    -  Primary   Due for CPE, will schedule over the next month or two depending on insurance coverage as she is between coverages right now   Missed period       Urine pregnancy negative. Will continue to monitor for return of regular cycles.   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 Continue benadryl prn       Follow up plan: Return in about 4 weeks (around 08/30/2016) for CPE.

## 2016-08-07 ENCOUNTER — Telehealth: Payer: Self-pay | Admitting: Family Medicine

## 2016-08-07 NOTE — Telephone Encounter (Signed)
Patient would like to speak with either Fleet Contrasachel or Amy regarding a medication Fleet ContrasRachel prescribed for her at the last visit.  She thinks Fleet ContrasRachel misunderstood her when she told her the medication she had been taking.  [phone]  Thanks

## 2016-08-08 NOTE — Telephone Encounter (Signed)
Routing to provider.  Fleet ContrasRachel, do you know what this means? Patient was given Valtrex and Benadryl 08/02/16

## 2016-08-08 NOTE — Telephone Encounter (Signed)
Called pt, could not leave VM

## 2016-08-08 NOTE — Telephone Encounter (Signed)
Called patient, unable to leave VM

## 2016-08-12 NOTE — Telephone Encounter (Signed)
No answer, voicemail box is full. 

## 2016-08-13 NOTE — Telephone Encounter (Signed)
Multiple attempts made to reach patient. No return calls. Patient has appt tomorrow, will discuss then.

## 2016-08-14 ENCOUNTER — Encounter: Payer: Medicaid Other | Admitting: Family Medicine

## 2016-08-16 DIAGNOSIS — B009 Herpesviral infection, unspecified: Secondary | ICD-10-CM | POA: Insufficient documentation

## 2016-08-16 DIAGNOSIS — F339 Major depressive disorder, recurrent, unspecified: Secondary | ICD-10-CM | POA: Insufficient documentation

## 2016-08-16 NOTE — Assessment & Plan Note (Signed)
Referral to Psychiatry sent to get back on track with treatments and therapy. Discussed if ever SI/HI, to go to the ER or [facility] walk in hours immediately.

## 2016-08-16 NOTE — Assessment & Plan Note (Signed)
Stable. Valtrex refills sent. Continue current regimen.

## 2017-01-11 ENCOUNTER — Emergency Department: Payer: Self-pay

## 2017-01-11 ENCOUNTER — Emergency Department
Admission: EM | Admit: 2017-01-11 | Discharge: 2017-01-12 | Disposition: A | Discharge status: Home / Self Care | Payer: Self-pay | Attending: Emergency Medicine | Admitting: Emergency Medicine

## 2017-01-11 DIAGNOSIS — F209 Schizophrenia, unspecified: Secondary | ICD-10-CM | POA: Insufficient documentation

## 2017-01-11 DIAGNOSIS — F17210 Nicotine dependence, cigarettes, uncomplicated: Secondary | ICD-10-CM | POA: Insufficient documentation

## 2017-01-11 DIAGNOSIS — J45909 Unspecified asthma, uncomplicated: Secondary | ICD-10-CM | POA: Insufficient documentation

## 2017-01-11 DIAGNOSIS — F319 Bipolar disorder, unspecified: Secondary | ICD-10-CM | POA: Insufficient documentation

## 2017-01-11 DIAGNOSIS — Z79899 Other long term (current) drug therapy: Secondary | ICD-10-CM | POA: Insufficient documentation

## 2017-01-11 DIAGNOSIS — R1084 Generalized abdominal pain: Secondary | ICD-10-CM | POA: Insufficient documentation

## 2017-01-11 LAB — CBC
HEMATOCRIT: 38.8 % (ref 35.0–47.0)
HEMOGLOBIN: 13.8 g/dL (ref 12.0–16.0)
MCH: 33.2 pg (ref 26.0–34.0)
MCHC: 35.5 g/dL (ref 32.0–36.0)
MCV: 93.6 fL (ref 80.0–100.0)
Platelets: 270 10*3/uL (ref 150–440)
RBC: 4.15 MIL/uL (ref 3.80–5.20)
RDW: 12.1 % (ref 11.5–14.5)
WBC: 14.3 10*3/uL — AB (ref 3.6–11.0)

## 2017-01-11 LAB — COMPREHENSIVE METABOLIC PANEL
ALK PHOS: 108 U/L (ref 38–126)
ALT: 38 U/L (ref 14–54)
ANION GAP: 10 (ref 5–15)
AST: 31 U/L (ref 15–41)
Albumin: 3.8 g/dL (ref 3.5–5.0)
BILIRUBIN TOTAL: 0.6 mg/dL (ref 0.3–1.2)
BUN: 12 mg/dL (ref 6–20)
CALCIUM: 9.1 mg/dL (ref 8.9–10.3)
CO2: 24 mmol/L (ref 22–32)
CREATININE: 0.9 mg/dL (ref 0.44–1.00)
Chloride: 105 mmol/L (ref 101–111)
Glucose, Bld: 101 mg/dL — ABNORMAL HIGH (ref 65–99)
Potassium: 3.5 mmol/L (ref 3.5–5.1)
Sodium: 139 mmol/L (ref 135–145)
TOTAL PROTEIN: 7 g/dL (ref 6.5–8.1)

## 2017-01-11 LAB — LIPASE, BLOOD: Lipase: 29 U/L (ref 11–51)

## 2017-01-11 LAB — URINALYSIS, COMPLETE (UACMP) WITH MICROSCOPIC
BACTERIA UA: NONE SEEN
BILIRUBIN URINE: NEGATIVE
Glucose, UA: NEGATIVE mg/dL
HGB URINE DIPSTICK: NEGATIVE
KETONES UR: NEGATIVE mg/dL
Leukocytes, UA: NEGATIVE
NITRITE: NEGATIVE
PROTEIN: NEGATIVE mg/dL
Specific Gravity, Urine: 1.011 (ref 1.005–1.030)
pH: 7 (ref 5.0–8.0)

## 2017-01-11 LAB — PREGNANCY, URINE: PREG TEST UR: NEGATIVE

## 2017-01-11 MED ORDER — ONDANSETRON HCL 4 MG/2ML IJ SOLN
4.0000 mg | INTRAMUSCULAR | Status: AC
  Administered 2017-01-11: 4 mg via INTRAVENOUS
  Filled 2017-01-11: qty 2

## 2017-01-11 MED ORDER — MORPHINE SULFATE (PF) 4 MG/ML IV SOLN
4.0000 mg | Freq: Once | INTRAVENOUS | Status: AC
  Administered 2017-01-11: 4 mg via INTRAVENOUS
  Filled 2017-01-11: qty 1

## 2017-01-11 MED ORDER — IOPAMIDOL (ISOVUE-300) INJECTION 61%
100.0000 mL | Freq: Once | INTRAVENOUS | Status: AC | PRN
  Administered 2017-01-11: 100 mL via INTRAVENOUS

## 2017-01-11 NOTE — ED Notes (Signed)
Placed under my assignment in error

## 2017-01-11 NOTE — ED Provider Notes (Signed)
[facility] Emergency Department Provider Note  ____________________________________________   First MD Initiated Contact with Patient 01/11/17 [phone]     (approximate)  I have reviewed the triage vital signs and the nursing notes.   HISTORY  Chief Complaint Abdominal Pain    HPI Lauren Wall is a 31 y.o. female with medical history as listed below who presents for evaluation of acute onset generalized lower abdominal pain.  the pain started acutely about 15 hours ago and was mild to moderate. it waxes and wanes in intensity but never goes away completely.  It has gradually gotten worse and is now severe.  Nothing makes it better and moving around makes it worse.  She denies nausea, vomiting, diarrhea, constipation, dysuria, fever/chills, chest pain, and shortness of breath.  She had a tubal ligation and C-section but no other abdominal surgeries.  Past Medical History:  Diagnosis Date  . Asthma   . Bipolar disorder (HCC)   . DDD (degenerative disc disease), thoracic   . Depression   . GERD (gastroesophageal reflux disease)   . Hidradenitis suppurativa   . Hyperlipidemia   . Liver disease   . Schizophrenia [facility](HCC)     Patient Active Problem List   Diagnosis Date Noted  . Depression, recurrent (HCC) 08/16/2016  . HSV (herpes simplex virus) infection 08/16/2016    Past Surgical History:  Procedure Laterality Date  . c sections  2006, 2007,2009, 2011  . TUBAL LIGATION      Prior to Admission medications   Medication Sig Start Date End Date Taking? Authorizing Provider  diphenhydrAMINE (BENADRYL) 50 MG tablet Take 1 tablet (50 mg total) by mouth at bedtime as needed for itching. 08/02/16   Particia NearingLane, Rachel Elizabeth, PA-C  EPINEPHrine (EPIPEN 2-PAK) 0.3 mg/0.3 mL IJ SOAJ injection Inject 0.3 mLs (0.3 mg total) into the muscle as needed. 08/02/16   Particia NearingLane, Rachel Elizabeth, PA-C  omeprazole (PRILOSEC) 40 MG capsule Take 1 capsule (40 mg total) by mouth daily.  01/12/15   Kozlow, Alvira PhilipsEric J, MD  valACYclovir (VALTREX) 1000 MG tablet Take 1 tablet (1,000 mg total) by mouth daily. 08/02/16   Particia NearingLane, Rachel Elizabeth, PA-C    Allergies Lactase; Sulfamethoxazole-trimethoprim; and Ibuprofen  Family History  Problem Relation Age of Onset  . Eczema Daughter   . Depression Mother   . CAD Father   . Depression Father   . Diabetes Father   . Glaucoma Father   . Hypertension Father   . Stroke Father   . Thyroid disease Father   . CAD Maternal Grandfather   . Stroke Maternal Grandfather   . Stroke Paternal Grandfather   . Cancer Paternal Grandfather        lung    Social History Social History  Substance Use Topics  . Smoking status: Current Every Day Smoker    Packs/day: 0.50    Types: Cigarettes  . Smokeless tobacco: Never Used  . Alcohol use 0.6 oz/week    1 Glasses of wine per week    Review of Systems Constitutional: No fever/chills Eyes: No visual changes. ENT: No sore throat. Cardiovascular: Denies chest pain. Respiratory: Denies shortness of breath. Gastrointestinal: abdominal pain, denies N/V/D Genitourinary: Negative for dysuria. Musculoskeletal: Negative for neck pain.  Negative for back pain. Integumentary: Negative for rash. Neurological: Negative for headaches, focal weakness or numbness.   ____________________________________________   PHYSICAL EXAM:  VITAL SIGNS: ED Triage Vitals  Enc Vitals Group     BP 01/11/17 0037 (!) 168/84  Pulse Rate 01/11/17 0037 97     Resp 01/11/17 0037 18     Temp 01/11/17 0037 98.7 F (37.1 C)     Temp Source 01/11/17 0037 Oral     SpO2 01/11/17 0037 99 %     Weight 01/11/17 0038 97.5 kg (215 lb)     Height 01/11/17 0038 1.613 m (5' 3.5")     Head Circumference --      Peak Flow --      Pain Score 01/11/17 0036 10     Pain Loc --      Pain Edu? --      Excl. in GC? --     Constitutional: Alert and oriented. Appears to be in moderate to severe pain Eyes: Conjunctivae are  normal.  Head: Atraumatic. Nose: No congestion/rhinnorhea. Mouth/Throat: Mucous membranes are moist. Neck: No stridor.  No meningeal signs.   Cardiovascular: Normal rate, regular rhythm. Good peripheral circulation. Grossly normal heart sounds. Respiratory: Normal respiratory effort.  No retractions. Lungs CTAB. Gastrointestinal: Soft with severe generalized tenderness throughout with mild rebound Genitourinary: deferred due to patient preference Musculoskeletal: No lower extremity tenderness nor edema. No gross deformities of extremities. Neurologic:  Normal speech and language. No gross focal neurologic deficits are appreciated.  Skin:  Skin is warm, dry and intact. No rash noted. Psychiatric: Mood and affect are normal. Speech and behavior are normal.  ____________________________________________   LABS (all labs ordered are listed, but only abnormal results are displayed)  Labs Reviewed  COMPREHENSIVE METABOLIC PANEL - Abnormal; Notable for the following:       Result Value   Glucose, Bld 101 (*)    All other components within normal limits  CBC - Abnormal; Notable for the following:    WBC 14.3 (*)    All other components within normal limits  URINALYSIS, COMPLETE (UACMP) WITH MICROSCOPIC - Abnormal; Notable for the following:    Color, Urine YELLOW (*)    APPearance CLEAR (*)    Squamous Epithelial / LPF 0-5 (*)    All other components within normal limits  LIPASE, BLOOD  PREGNANCY, URINE   ____________________________________________  EKG  None - EKG not ordered by ED physician ____________________________________________  RADIOLOGY   Ct Abdomen Pelvis W Contrast  Result Date: 01/11/2017 CLINICAL DATA:  Bilateral lower abdominal pain starting this morning. History of tubal ligation. No urinary symptoms, nausea, vomiting or diarrhea. EXAM: CT ABDOMEN AND PELVIS WITH CONTRAST TECHNIQUE: Multidetector CT imaging of the abdomen and pelvis was performed using the  standard protocol following bolus administration of intravenous contrast. CONTRAST:  ISOVUE-300 IOPAMIDOL (ISOVUE-300) INJECTION 61% COMPARISON:  None. FINDINGS: Lower chest: No acute abnormality. Hepatobiliary: No focal liver abnormality is seen. No gallstones, gallbladder wall thickening, or biliary dilatation. Pancreas: Unremarkable. No pancreatic ductal dilatation or surrounding inflammatory changes. Spleen: Normal in size without focal abnormality. Adrenals/Urinary Tract: Adrenal glands are unremarkable. Kidneys are normal, without renal calculi, focal lesion, or hydronephrosis. Bladder is unremarkable. Stomach/Bowel: Stomach is within normal limits. Appendix is not confidently identified but no pericecal inflammation is noted. No evidence of bowel wall thickening, distention, or inflammatory changes. Vascular/Lymphatic: No significant vascular findings are present. No enlarged abdominal or pelvic lymph nodes. Reproductive: Uterus and bilateral adnexa are unremarkable. Other: No abdominal wall hernia or abnormality. No abdominopelvic ascites. Musculoskeletal: No acute or significant osseous findings. IMPRESSION: No acute intraabdominal nor pelvic abnormality. Electronically Signed   By: Tollie Eth M.D.   On: 01/11/2017 03:33    ____________________________________________  PROCEDURES  Critical Care performed: No   Procedure(s) performed:   Procedures   ____________________________________________   INITIAL IMPRESSION / ASSESSMENT AND PLAN / ED COURSE  As part of my medical decision making, I reviewed the following data within the electronic MEDICAL RECORD NUMBER Nursing notes reviewed and incorporated and Labs reviewed     Differential diagnosis includes, but is not limited to, ovarian cyst, ovarian torsion, acute appendicitis, diverticulitis, urinary tract infection/pyelonephritis, endometriosis, bowel obstruction, colitis, renal colic, gastroenteritis, hernia, pregnancy related  pain including ectopic pregnancy, etc.  pregnancy test is negative, lipase is normal, CMP is normal, and the patient has a mild leukocytosis of about 14.  Given the degree of tenderness and pain she is experiencing I will evaluate with a CT scan with IV contrast only.  Morphine and Zofran for symptom control.    Clinical Course as of Jan 12 532  Sat Jan 11, 2017  1610 No acute pathology identified on CT scan. CT Abdomen Pelvis W Contrast [CF]  0527 Reassessed patient, she states she feels much better.  Explained negative CT scan.  Discussed additional intervention/investigation including pelvic exam w/ swabs, less likely ultrasound, and explained why (rule out infection/STD).  Patient does not want pelvic tonight and just wants to go home since she feels better.    I gave my usual and customary return precautions.    [CF]    Clinical Course User Index [CF] Loleta Rose, MD    ____________________________________________  FINAL CLINICAL IMPRESSION(S) / ED DIAGNOSES  Final diagnoses:  Generalized abdominal pain     MEDICATIONS GIVEN DURING THIS VISIT:  Medications  morphine 4 MG/ML injection 4 mg (4 mg Intravenous Given 01/11/17 0252)  ondansetron (ZOFRAN) injection 4 mg (4 mg Intravenous Given 01/11/17 0252)  iopamidol (ISOVUE-300) 61 % injection 100 mL (100 mLs Intravenous Contrast Given 01/11/17 0316)     NEW OUTPATIENT MEDICATIONS STARTED DURING THIS VISIT:  New Prescriptions   No medications on file    Modified Medications   No medications on file    Discontinued Medications   No medications on file     Note:  This document was prepared using Dragon voice recognition software and may include unintentional dictation errors.    Loleta Rose, MD 01/11/17 [phone]

## 2017-01-11 NOTE — ED Notes (Signed)
Pt returned from CT °

## 2017-01-11 NOTE — ED Notes (Signed)
Pt is going to CT. 

## 2017-01-11 NOTE — ED Triage Notes (Signed)
Pt ambulatory to triage with no difficulty. Pt reports bilateral lower abd pain that started this morning. Pt states pains comes in waves and feels like contractions at times. Pt reports hx of tubal ligation. Denies urinary sx, n/v or diarrhea.

## 2017-01-11 NOTE — Discharge Instructions (Signed)

## 2017-03-28 ENCOUNTER — Other Ambulatory Visit: Payer: Self-pay

## 2017-03-28 ENCOUNTER — Emergency Department: Payer: Self-pay

## 2017-03-28 ENCOUNTER — Emergency Department
Admission: EM | Admit: 2017-03-28 | Discharge: 2017-03-28 | Disposition: A | Discharge status: Home / Self Care | Payer: Self-pay | Attending: Emergency Medicine | Admitting: Emergency Medicine

## 2017-03-28 DIAGNOSIS — X500XXA Overexertion from strenuous movement or load, initial encounter: Secondary | ICD-10-CM | POA: Insufficient documentation

## 2017-03-28 DIAGNOSIS — S43402A Unspecified sprain of left shoulder joint, initial encounter: Secondary | ICD-10-CM | POA: Insufficient documentation

## 2017-03-28 DIAGNOSIS — F17210 Nicotine dependence, cigarettes, uncomplicated: Secondary | ICD-10-CM | POA: Insufficient documentation

## 2017-03-28 DIAGNOSIS — Y92838 Other recreation area as the place of occurrence of the external cause: Secondary | ICD-10-CM | POA: Insufficient documentation

## 2017-03-28 DIAGNOSIS — Z79899 Other long term (current) drug therapy: Secondary | ICD-10-CM | POA: Insufficient documentation

## 2017-03-28 DIAGNOSIS — Y93B9 Activity, other involving muscle strengthening exercises: Secondary | ICD-10-CM | POA: Insufficient documentation

## 2017-03-28 DIAGNOSIS — Y998 Other external cause status: Secondary | ICD-10-CM | POA: Insufficient documentation

## 2017-03-28 DIAGNOSIS — J45909 Unspecified asthma, uncomplicated: Secondary | ICD-10-CM | POA: Insufficient documentation

## 2017-03-28 MED ORDER — TRAMADOL HCL 50 MG PO TABS: 50.0000 mg | ORAL_TABLET | Freq: Four times a day (QID) | ORAL | 0 refills | Status: AC | PRN

## 2017-03-28 MED ORDER — TRAMADOL HCL 50 MG PO TABS
50.0000 mg | ORAL_TABLET | Freq: Once | ORAL | Status: AC
  Administered 2017-03-28: 50 mg via ORAL
  Filled 2017-03-28: qty 1

## 2017-03-28 NOTE — ED Provider Notes (Signed)
[facility] Emergency Department Provider Note   ____________________________________________    I have reviewed the triage vital signs and the nursing notes.   HISTORY  Chief Complaint Shoulder Pain     HPI Lauren Wall is a 32 y.o. female who presents with complaints of left shoulder pain.  Patient reports yesterday she was at the gym working out on a machine which required her arms up with significant adduction against weight.  She reports she felt a pop while straining against the weight in her left shoulder.  She reports she has had 10 out of 10 pain since then.  Very painful to move her arm.  No history of arm dislocation.  Pain has extended to include her left lateral neck and left shoulder blade.  She has not taken anything for   Past Medical History:  Diagnosis Date  . Asthma   . Bipolar disorder (HCC)   . DDD (degenerative disc disease), thoracic   . Depression   . GERD (gastroesophageal reflux disease)   . Hidradenitis suppurativa   . Hyperlipidemia   . Liver disease   . Schizophrenia [facility])     Patient Active Problem List   Diagnosis Date Noted  . Depression, recurrent (HCC) 08/16/2016  . HSV (herpes simplex virus) infection 08/16/2016    Past Surgical History:  Procedure Laterality Date  . c sections  2006, 2007,2009, 2011  . TUBAL LIGATION      Prior to Admission medications   Medication Sig Start Date End Date Taking? Authorizing Provider  omeprazole (PRILOSEC) 40 MG capsule Take 1 capsule (40 mg total) by mouth daily. 01/12/15  Yes Kozlow, Alvira Philips, MD  diphenhydrAMINE (BENADRYL) 50 MG tablet Take 1 tablet (50 mg total) by mouth at bedtime as needed for itching. 08/02/16   Particia Nearing, PA-C  EPINEPHrine (EPIPEN 2-PAK) 0.3 mg/0.3 mL IJ SOAJ injection Inject 0.3 mLs (0.3 mg total) into the muscle as needed. 08/02/16   Particia Nearing, PA-C  traMADol (ULTRAM) 50 MG tablet Take 1 tablet (50 mg total) by mouth  every 6 (six) hours as needed. 03/28/17 03/28/18  Jene Every, MD  valACYclovir (VALTREX) 1000 MG tablet Take 1 tablet (1,000 mg total) by mouth daily. Patient not taking: Reported on 03/28/2017 08/02/16   Particia Nearing, PA-C     Allergies Ibuprofen; Lactase; and Sulfamethoxazole-trimethoprim  Family History  Problem Relation Age of Onset  . Eczema Daughter   . Depression Mother   . CAD Father   . Depression Father   . Diabetes Father   . Glaucoma Father   . Hypertension Father   . Stroke Father   . Thyroid disease Father   . CAD Maternal Grandfather   . Stroke Maternal Grandfather   . Stroke Paternal Grandfather   . Cancer Paternal Grandfather        lung    Social History Social History   Tobacco Use  . Smoking status: Current Every Day Smoker    Packs/day: 0.50    Types: Cigarettes  . Smokeless tobacco: Never Used  Substance Use Topics  . Alcohol use: Yes    Alcohol/week: 0.6 oz    Types: 1 Glasses of wine per week  . Drug use: No    Review of Systems  Constitutional: No dizziness Eyes: No visual changes.  ENT: Neck pain as above Cardiovascular: Denies chest pain. Respiratory: Denies shortness of breath. Gastrointestinal: No abdominal pain.     Genitourinary: No dysuria or  frequency Musculoskeletal: As above Skin: Negative for rash. Neurological: Negative for headaches   ____________________________________________   PHYSICAL EXAM:  VITAL SIGNS: ED Triage Vitals  Enc Vitals Group     BP 03/28/17 0901 134/76     Pulse Rate 03/28/17 0901 92     Resp 03/28/17 0901 20     Temp 03/28/17 0901 97.8 F (36.6 C)     Temp Source 03/28/17 0901 Oral     SpO2 03/28/17 0901 100 %     Weight 03/28/17 0902 97.5 kg (215 lb)     Height 03/28/17 0902 1.626 m (5\' 4" )     Head Circumference --      Peak Flow --      Pain Score 03/28/17 0901 9     Pain Loc --      Pain Edu? --      Excl. in GC? --     Constitutional: Alert and oriented. No acute  distress. Pleasant and interactive Eyes: Conjunctivae are normal.   Nose: No congestion/rhinnorhea. Mouth/Throat: Mucous membranes are moist.   Neck: Tenderness palpation along the left lateral trapezius, no bony abnormalities, no bony tenderness to palpation Cardiovascular: Normal rate, regular rhythm.  Good peripheral circulation. Respiratory: Normal respiratory effort.  No retractions.  Gastrointestinal: Soft and nontender.  No CVA tenderness. Genitourinary: deferred Musculoskeletal: Left arm: Warm and well perfused, patient is able to abduct the arm very slowly but is quite painful.  She is tender diffusely around the shoulder without significant swelling.  AC joint appears intact Neurologic:  Normal speech and language. No gross focal neurologic deficits are appreciated.  Skin:  Skin is warm, dry and intact. No rash noted. Psychiatric: Mood and affect are normal. Speech and behavior are normal.  ____________________________________________   LABS (all labs ordered are listed, but only abnormal results are displayed)  Labs Reviewed - No data to display ____________________________________________  EKG  None ____________________________________________  RADIOLOGY  X-ray shoulder unremarkable ____________________________________________   PROCEDURES  Procedure(s) performed: No  Procedures   Critical Care performed:No ____________________________________________   INITIAL IMPRESSION / ASSESSMENT AND PLAN / ED COURSE  Pertinent labs & imaging results that were available during my care of the patient were reviewed by me and considered in my medical decision making (see chart for details).  Patient presents with left shoulder pain.  Differential diagnosis includes dislocation, rotator cuff tear, strain, sprain  Will obtain x-rays give analgesics and reevaluate  Significant improvement in pain after tramadol.  X-rays unremarkable.  Placed in sling, Alice Riegerrth O follow-up  for further evaluation    ____________________________________________   FINAL CLINICAL IMPRESSION(S) / ED DIAGNOSES  Final diagnoses:  Sprain of left shoulder, unspecified shoulder sprain type, initial encounter        Note:  This document was prepared using Dragon voice recognition software and may include unintentional dictation errors.    Jene EveryKinner, Drezden Seitzinger, MD 03/28/17 [phone]

## 2017-03-28 NOTE — ED Notes (Signed)
Patient transported to X-ray 

## 2017-03-28 NOTE — ED Triage Notes (Addendum)
Pt presents to ED via POV with c/o LEFT shoulder pain since yesterday s/p work-out injury. Pt states she was exercising and "might have used too much weight". Pt reports it "took 2 people to help her out of her lab coat". Pt is A&O, in NAD; RR even, regular, and unlabored.

## 2017-03-28 NOTE — ED Notes (Signed)
Pt sling applied. VSS. NAD. Discharge instructions and RX reviewed. No questions or concerns voiced at this time.

## 2019-03-21 IMAGING — CT CT ABD-PELV W/ CM
2 of 4 series · 16 of 46 positions shown, 18 images · IV contrast (iopamidol)
Comparison: None.

CLINICAL DATA: Bilateral lower abdominal pain starting this
morning. History of tubal ligation. No urinary symptoms, nausea,
vomiting or diarrhea.

EXAM:
CT ABDOMEN AND PELVIS WITH CONTRAST
TECHNIQUE: Multidetector CT imaging of the abdomen and pelvis was performed
using the standard protocol following bolus administration of
intravenous contrast.
CONTRAST:  100mL 2JEDRY-XUU IOPAMIDOL (2JEDRY-XUU) INJECTION 61%

[Series 2: routine abd/pel with · axial · 0.93mm/px · z∈[-974,-524]mm · 13 of 100 slices shown, 15 images]
[im 5/100  soft-tissue]
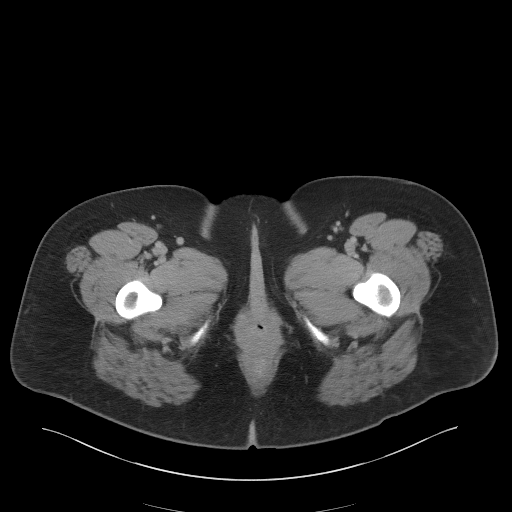
[im 5/100  bone]
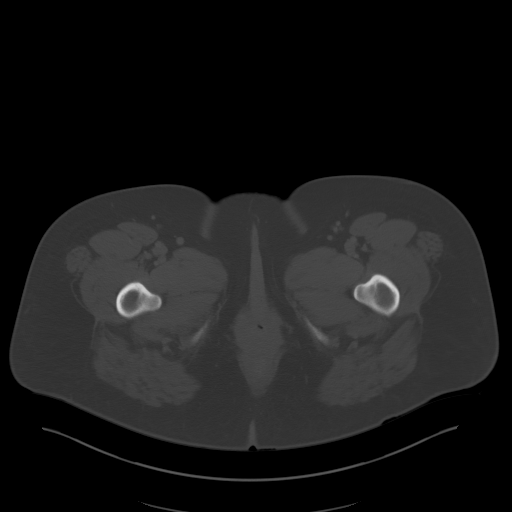
[im 13/100  soft-tissue]
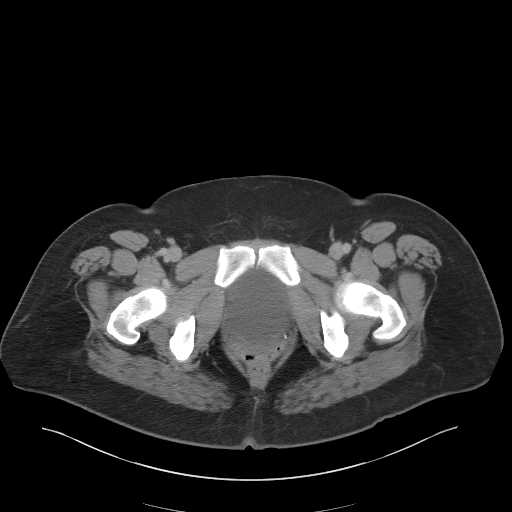
[im 22/100  soft-tissue]
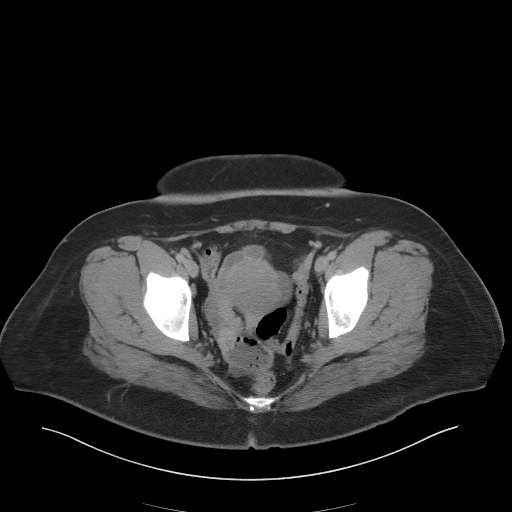
[im 26/100  soft-tissue]
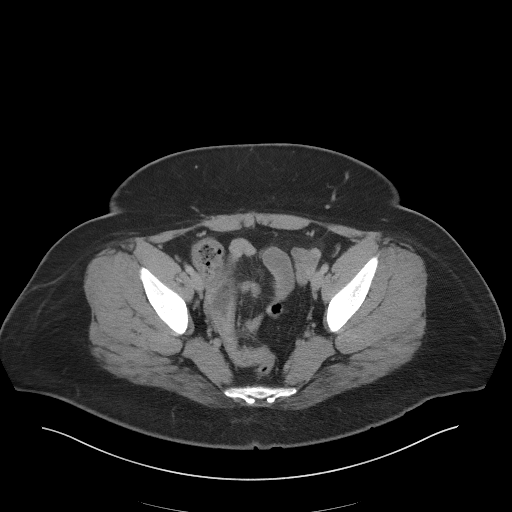
[im 35/100  soft-tissue]
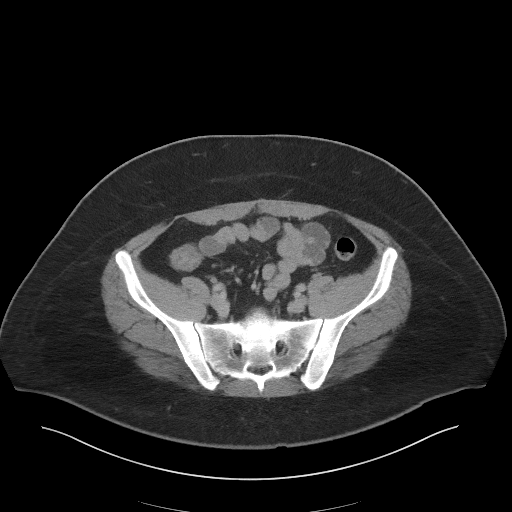
[im 44/100  soft-tissue]
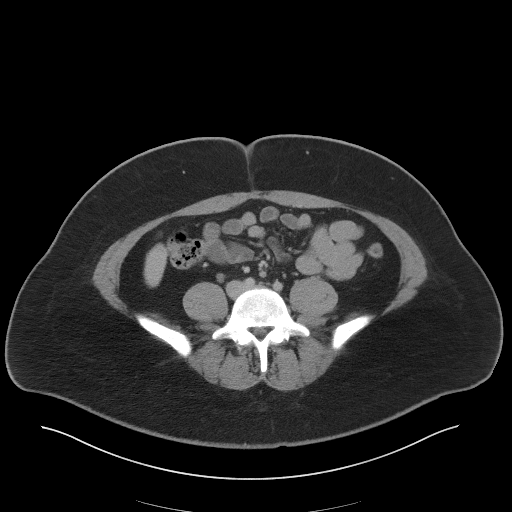
[im 52/100  soft-tissue]
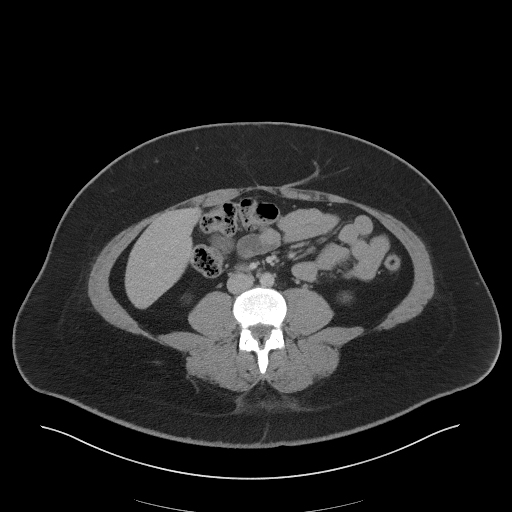
[im 56/100  soft-tissue]
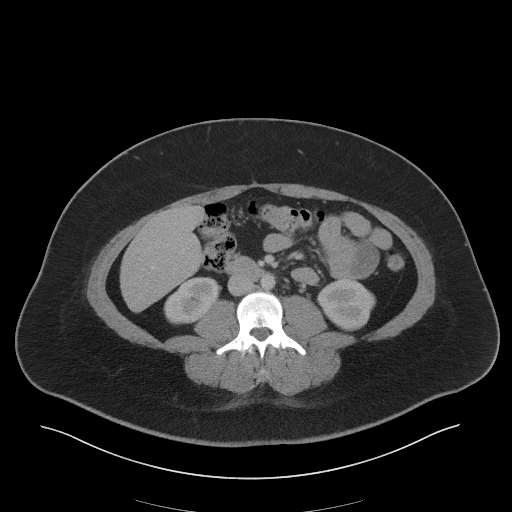
[im 65/100  soft-tissue]
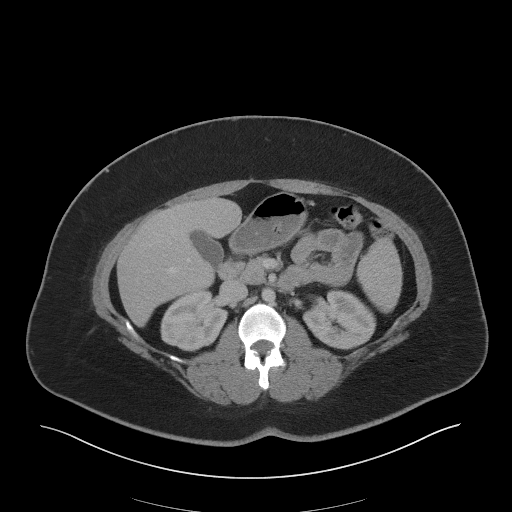
[im 65/100  bone]
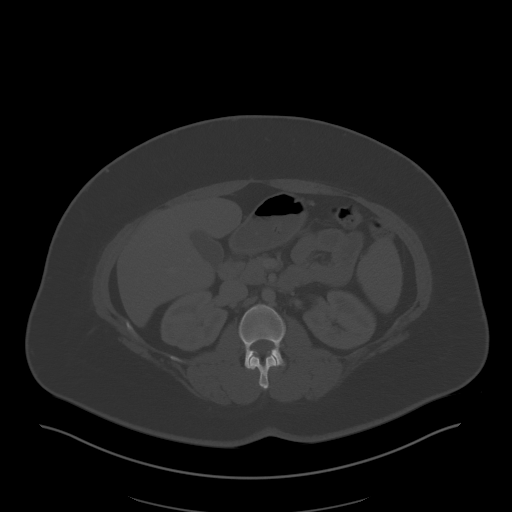
[im 74/100  soft-tissue]
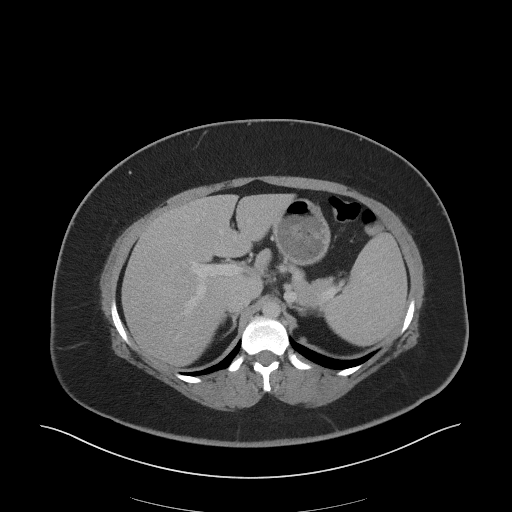
[im 78/100  soft-tissue]
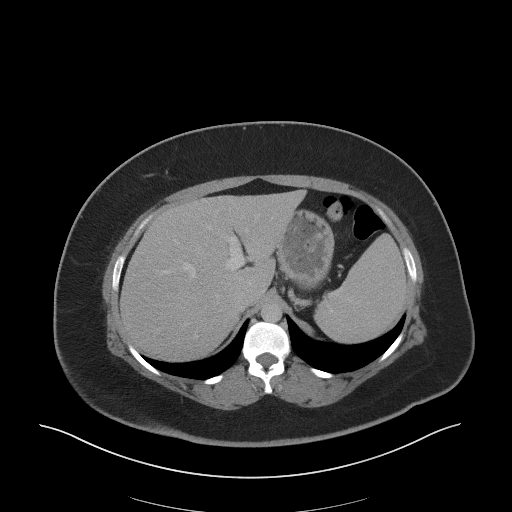
[im 87/100  soft-tissue]
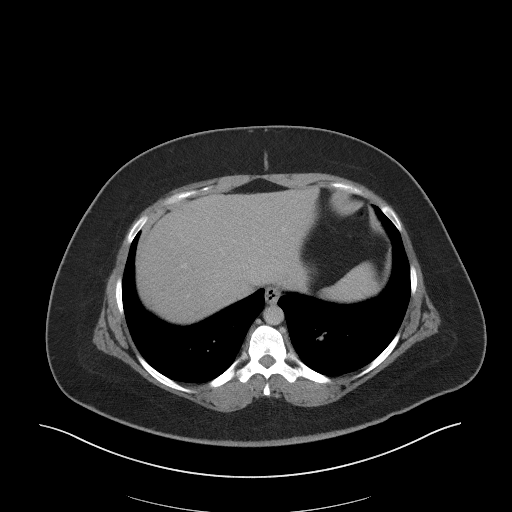
[im 95/100  soft-tissue]
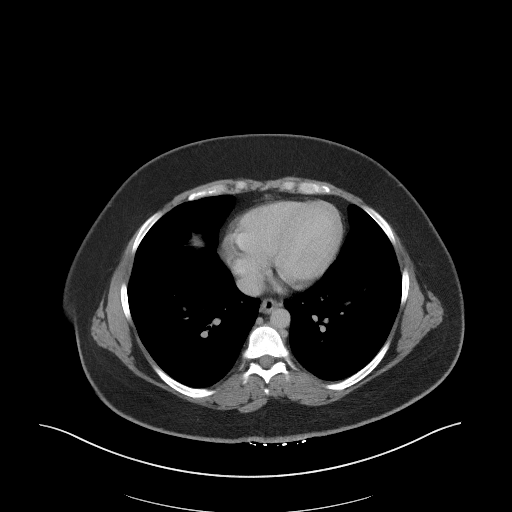

[Series 5: coronal st · coronal · 0.72mm/px · 3 of 91 slices shown]
[im 31/91  soft-tissue]
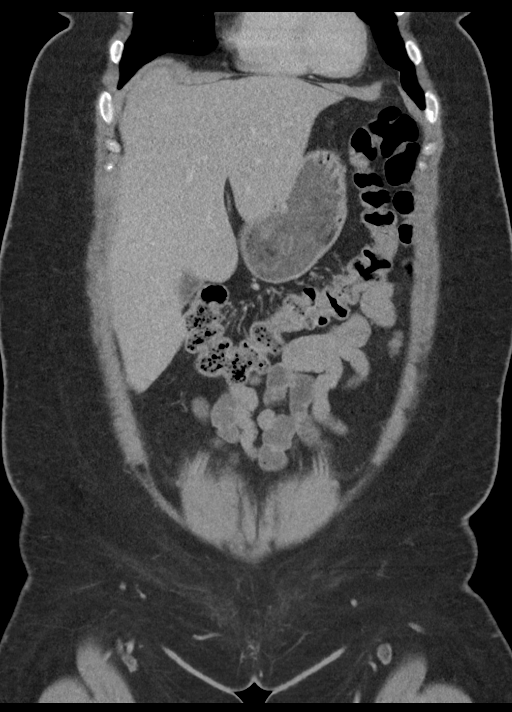
[im 41/91  soft-tissue]
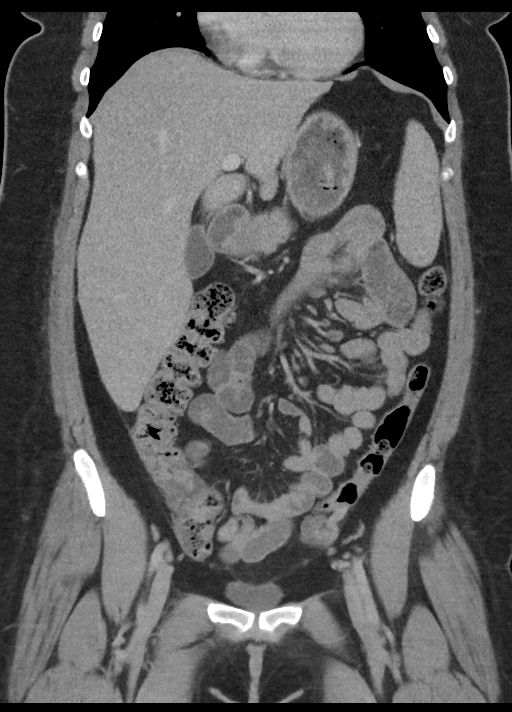
[im 51/91  soft-tissue]
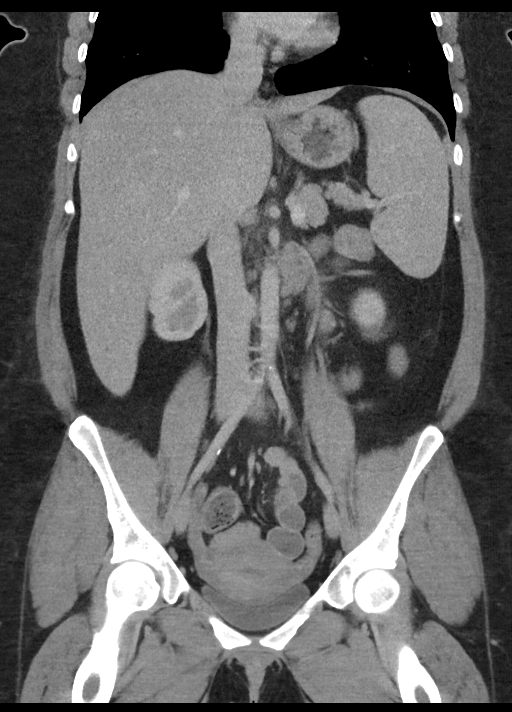

[16 of 46 positions shown; findings below may reference images not displayed]

FINDINGS: Lower chest: No acute abnormality.

Hepatobiliary: No focal liver abnormality is seen. No gallstones,
gallbladder wall thickening, or biliary dilatation.

Pancreas: Unremarkable. No pancreatic ductal dilatation or
surrounding inflammatory changes.

Spleen: Normal in size without focal abnormality.

Adrenals/Urinary Tract: Adrenal glands are unremarkable. Kidneys are
normal, without renal calculi, focal lesion, or hydronephrosis.
Bladder is unremarkable.

Stomach/Bowel: Stomach is within normal limits. Appendix is not
confidently identified but no pericecal inflammation is noted. No
evidence of bowel wall thickening, distention, or inflammatory
changes.

Vascular/Lymphatic: No significant vascular findings are present. No
enlarged abdominal or pelvic lymph nodes.

Reproductive: Uterus and bilateral adnexa are unremarkable.

Other: No abdominal wall hernia or abnormality. No abdominopelvic
ascites.

Musculoskeletal: No acute or significant osseous findings.
IMPRESSION: No acute intraabdominal nor pelvic abnormality.

## 2022-04-16 NOTE — Progress Notes (Signed)
Sleep Medicine   Office Visit  Patient Name: Lauren Wall DOB: 1986/02/20 MRN [id number]    Chief Complaint: Sleep consult  Brief History:  Lauren Wall presents for an initial consult for sleep evaluation and to establish care. Patient has a long history of snoring. Sleep quality is poor. This is noted most nights. The patient's bed partner reports  snore, choking and gasping at night. The patient relates the following symptoms: some headaches, trouble concentrating and brain fogginess, fatigue are also present. The patient goes to sleep at 1100 pm and wakes up at 0645 am and will wake up several times in the middle in between.  Sleep quality is the same when outside home environment.  Patient has noted significant movement of her legs at night that would disrupt her sleep.  The patient  relates no unusual behavior during the night.  The patient relates bipolar disorder, Schizophrenia, ADHD, anxiety as a history of psychiatric problems. The Epworth Sleepiness Score is 19 out of 24 .  The patient relates  Cardiovascular risk factors include: none.   ROS  General: (-) fever, (-) chills, (-) night sweat Nose and Sinuses: (-) nasal stuffiness or itchiness, (-) postnasal drip, (-) nosebleeds, (-) sinus trouble. Mouth and Throat: (-) sore throat, (-) hoarseness. Neck: (-) swollen glands, (-) enlarged thyroid, (-) neck pain. Respiratory: - cough, - shortness of breath, - wheezing. Neurologic: - numbness, - tingling. Psychiatric: + anxiety, - depression Sleep behavior: -sleep paralysis -hypnogogic hallucinations -dream enactment      -vivid dreams -cataplexy -night terrors -sleep walking   Current Medication: Outpatient Encounter Medications as of 04/17/2022  Medication Sig   albuterol (VENTOLIN HFA) 108 (90 Base) MCG/ACT inhaler Inhale into the lungs every 6 (six) hours as needed for wheezing or shortness of breath.   atenolol (TENORMIN) 50 MG tablet Take 50 mg by mouth daily.   Cariprazine HCl  (VRAYLAR) 4.5 MG CAPS Take by mouth.   cloNIDine (CATAPRES) 0.1 MG tablet Take 0.1 mg by mouth 2 (two) times daily.   Doxepin HCl 3 MG TABS Take by mouth.   gabapentin (NEURONTIN) 100 MG capsule Take 100 mg by mouth 3 (three) times daily.   metFORMIN (GLUCOPHAGE) 500 MG tablet Take by mouth 2 (two) times daily with a meal.   norethindrone (MICRONOR) 0.35 MG tablet Take 1 tablet by mouth daily.   No facility-administered encounter medications on file as of 04/17/2022.    Surgical History: Past Surgical History:  Procedure Laterality Date   c sections  2006, 2007,2009, 2011   TUBAL LIGATION      Medical History: No past medical history on file.  Family History: Non contributory to the present illness  Social History: Social History   Socioeconomic History   Marital status: Not on file    Spouse name: Not on file   Number of children: Not on file   Years of education: Not on file   Highest education level: Not on file  Occupational History   Not on file  Tobacco Use   Smoking status: Every Day    Packs/day: 0.50    Types: Cigarettes   Smokeless tobacco: Never  Substance and Sexual Activity   Alcohol use: Yes   Drug use: Not on file   Sexual activity: Not on file  Other Topics Concern   Not on file  Social History Narrative   Not on file   Social Determinants of Health   Financial Resource Strain: Not on file  Food Insecurity: Not on  file  Transportation Needs: Not on file  Physical Activity: Not on file  Stress: Not on file  Social Connections: Not on file  Intimate Partner Violence: Not on file    Vital Signs: Blood pressure 139/84, pulse 78, resp. rate 18, height 5\' 4"  (1.626 m), weight 259 lb (117.5 kg), SpO2 97 %. Body mass index is 44.46 kg/m.   Examination: General Appearance: The patient is well-developed, well-nourished, and in no distress. Neck Circumference: 44 cm Skin: Gross inspection of skin unremarkable. Head: normocephalic, no gross  deformities. Eyes: no gross deformities noted. ENT: ears appear grossly normal Neurologic: Alert and oriented. No involuntary movements.    STOP BANG RISK ASSESSMENT S (snore) Have you been told that you snore?     YES   T (tired) Are you often tired, fatigued, or sleepy during the day?   YES  O (obstruction) Do you stop breathing, choke, or gasp during sleep? YES   P (pressure) Do you have or are you being treated for high blood pressure? NO   B (BMI) Is your body index greater than 35 kg/m? YES   A (age) Are you 89 years old or older? NO   N (neck) Do you have a neck circumference greater than 16 inches?   NO   G (gender) Are you a female? NO   TOTAL STOP/BANG "YES" ANSWERS 4                                                               A STOP-Bang score of 2 or less is considered low risk, and a score of 5 or more is high risk for having either moderate or severe OSA. For people who score 3 or 4, doctors may need to perform further assessment to determine how likely they are to have OSA.         EPWORTH SLEEPINESS SCALE:  Scale:  (0)= no chance of dozing; (1)= slight chance of dozing; (2)= moderate chance of dozing; (3)= high chance of dozing  Chance  Situtation    Sitting and reading: 3    Watching TV: 3    Sitting Inactive in public: 1    As a passenger in car: 3      Lying down to rest: 3    Sitting and talking: 1    Sitting quielty after lunch: 3    In a car, stopped in traffic: 2   TOTAL SCORE:   19 out of 24    SLEEP STUDIES:  None   LABS: No results found for this or any previous visit (from the past 2160 hour(s)).  Radiology: Patient was never admitted.  No results found.  No results found.    Assessment and Plan: Patient Active Problem List   Diagnosis Date Noted   Bipolar disorder ([place]) 04/17/2022   Anxiety 04/17/2022   Schizophrenia ([place]) 04/17/2022     PLAN OSA:   Patient evaluation suggests high risk of sleep  disordered breathing due to snoring, choking, gasping at night, frequent nighttime awakenings, some headaches, trouble concentrating, brain fogginess, fatigue, and elevated BMI.   Suggest: PSG to assess/treat the patient's sleep disordered breathing. The patient was also counselled on wt loss to optimize sleep health.   1. Hypersomnia Will order PSG  2.  RLS (restless legs syndrome) Will evaluate with PSG  3. Bipolar affective disorder, remission status unspecified ([place]) Followed by psych  4. Schizophrenia, unspecified type ([place]) Followed by psych  5. Anxiety Followed by psych  6. Morbid obesity with BMI of 40.0-44.9, adult ([place]) Obesity Counseling: Had a lengthy discussion regarding patients BMI and weight issues. Patient was instructed on portion control as well as increased activity. Also discussed caloric restrictions with trying to maintain intake less than 2000 Kcal. Discussions were made in accordance with the 5As of weight management. Simple actions such as not eating late and if able to, taking a walk is suggested.    General Counseling: I have discussed the findings of the evaluation and examination with Lissy.  I have also discussed any further diagnostic evaluation thatmay be needed or ordered today. Trenise verbalizes understanding of the findings of todays visit. We also reviewed her medications today and discussed drug interactions and side effects including but not limited excessive drowsiness and altered mental states. We also discussed that there is always a risk not just to her but also people around her. she has been encouraged to call the office with any questions or concerns that should arise related to todays visit.  No orders of the defined types were placed in this encounter.       I have personally obtained a history, evaluated the patient, evaluated pertinent data, formulated the assessment and plan and placed orders.  This patient was seen by Drema Dallas, PA-C in collaboration with Dr. Devona Konig as a part of collaborative care agreement.    Allyne Gee, MD [facility] Diplomate ABMS Pulmonary and Critical Care Medicine Sleep medicine

## 2022-04-17 ENCOUNTER — Ambulatory Visit (INDEPENDENT_AMBULATORY_CARE_PROVIDER_SITE_OTHER): Payer: Medicaid Other | Admitting: Internal Medicine

## 2022-04-17 VITALS — BP 139/84 | HR 78 | Resp 18 | Ht 64.0 in | Wt 259.0 lb

## 2022-04-17 DIAGNOSIS — F419 Anxiety disorder, unspecified: Secondary | ICD-10-CM

## 2022-04-17 DIAGNOSIS — F209 Schizophrenia, unspecified: Secondary | ICD-10-CM

## 2022-04-17 DIAGNOSIS — G2581 Restless legs syndrome: Secondary | ICD-10-CM

## 2022-04-17 DIAGNOSIS — F319 Bipolar disorder, unspecified: Secondary | ICD-10-CM

## 2022-04-17 DIAGNOSIS — G4710 Hypersomnia, unspecified: Secondary | ICD-10-CM

## 2022-04-17 DIAGNOSIS — Z6841 Body Mass Index (BMI) 40.0 and over, adult: Secondary | ICD-10-CM
